# Patient Record
Sex: Female | Born: 1955 | Race: Black or African American | Hispanic: No | Marital: Married | Smoking: Never smoker
Health system: Southern US, Community
[De-identification: ages and names within clinical notes are randomized; demographics above are authoritative.]

## PROBLEM LIST (undated history)

## (undated) DIAGNOSIS — M8580 Other specified disorders of bone density and structure, unspecified site: Secondary | ICD-10-CM

## (undated) DIAGNOSIS — C50919 Malignant neoplasm of unspecified site of unspecified female breast: Secondary | ICD-10-CM

## (undated) DIAGNOSIS — C801 Malignant (primary) neoplasm, unspecified: Secondary | ICD-10-CM

## (undated) DIAGNOSIS — M7501 Adhesive capsulitis of right shoulder: Secondary | ICD-10-CM

## (undated) DIAGNOSIS — I10 Essential (primary) hypertension: Secondary | ICD-10-CM

## (undated) HISTORY — DX: Malignant (primary) neoplasm, unspecified: C80.1

## (undated) HISTORY — DX: Other specified disorders of bone density and structure, unspecified site: M85.80

## (undated) HISTORY — PX: MYOMECTOMY: SHX85

## (undated) HISTORY — PX: COLONOSCOPY: SHX174

## (undated) HISTORY — DX: Malignant neoplasm of unspecified site of unspecified female breast: C50.919

---
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
## 2002-03-25 ENCOUNTER — Encounter: Payer: Self-pay | Admitting: Internal Medicine

## 2002-03-25 ENCOUNTER — Encounter: Admission: RE | Admit: 2002-03-25 | Discharge: 2002-03-25 | Payer: Self-pay | Admitting: Internal Medicine

## 2002-03-31 ENCOUNTER — Encounter: Admission: RE | Admit: 2002-03-31 | Discharge: 2002-03-31 | Payer: Self-pay | Admitting: Internal Medicine

## 2002-03-31 ENCOUNTER — Encounter: Payer: Self-pay | Admitting: Internal Medicine

## 2002-07-24 ENCOUNTER — Encounter: Admission: RE | Admit: 2002-07-24 | Discharge: 2002-07-24 | Payer: Self-pay | Admitting: Internal Medicine

## 2002-07-24 ENCOUNTER — Encounter: Payer: Self-pay | Admitting: Internal Medicine

## 2003-06-01 ENCOUNTER — Other Ambulatory Visit: Admission: RE | Admit: 2003-06-01 | Discharge: 2003-06-01 | Payer: Self-pay | Admitting: Obstetrics and Gynecology

## 2003-06-08 ENCOUNTER — Encounter: Admission: RE | Admit: 2003-06-08 | Discharge: 2003-06-08 | Payer: Self-pay | Admitting: Internal Medicine

## 2003-06-08 ENCOUNTER — Encounter: Payer: Self-pay | Admitting: Internal Medicine

## 2003-06-14 ENCOUNTER — Inpatient Hospital Stay (HOSPITAL_COMMUNITY): Admission: RE | Admit: 2003-06-14 | Discharge: 2003-06-19 | Payer: Self-pay | Admitting: Obstetrics and Gynecology

## 2003-06-14 ENCOUNTER — Encounter (INDEPENDENT_AMBULATORY_CARE_PROVIDER_SITE_OTHER): Payer: Self-pay | Admitting: *Deleted

## 2003-06-14 ENCOUNTER — Encounter: Payer: Self-pay | Admitting: Obstetrics and Gynecology

## 2003-06-15 ENCOUNTER — Encounter: Payer: Self-pay | Admitting: Obstetrics and Gynecology

## 2003-11-11 ENCOUNTER — Encounter: Admission: RE | Admit: 2003-11-11 | Discharge: 2003-11-11 | Payer: Self-pay | Admitting: Obstetrics and Gynecology

## 2003-11-11 ENCOUNTER — Encounter (INDEPENDENT_AMBULATORY_CARE_PROVIDER_SITE_OTHER): Payer: Self-pay | Admitting: *Deleted

## 2003-11-11 ENCOUNTER — Encounter (INDEPENDENT_AMBULATORY_CARE_PROVIDER_SITE_OTHER): Payer: Self-pay | Admitting: Radiology

## 2003-11-24 ENCOUNTER — Encounter (HOSPITAL_COMMUNITY): Admission: RE | Admit: 2003-11-24 | Discharge: 2003-11-26 | Payer: Self-pay | Admitting: General Surgery

## 2003-11-29 ENCOUNTER — Ambulatory Visit (HOSPITAL_COMMUNITY): Admission: RE | Admit: 2003-11-29 | Discharge: 2003-11-29 | Payer: Self-pay | Admitting: General Surgery

## 2003-11-29 ENCOUNTER — Encounter (INDEPENDENT_AMBULATORY_CARE_PROVIDER_SITE_OTHER): Payer: Self-pay | Admitting: *Deleted

## 2003-11-29 ENCOUNTER — Encounter: Admission: RE | Admit: 2003-11-29 | Discharge: 2003-11-29 | Payer: Self-pay | Admitting: General Surgery

## 2003-11-29 ENCOUNTER — Ambulatory Visit (HOSPITAL_BASED_OUTPATIENT_CLINIC_OR_DEPARTMENT_OTHER): Admission: RE | Admit: 2003-11-29 | Discharge: 2003-11-29 | Payer: Self-pay | Admitting: General Surgery

## 2003-12-27 ENCOUNTER — Encounter: Admission: RE | Admit: 2003-12-27 | Discharge: 2003-12-27 | Payer: Self-pay | Admitting: Oncology

## 2003-12-28 ENCOUNTER — Ambulatory Visit: Admission: RE | Admit: 2003-12-28 | Discharge: 2004-01-25 | Payer: Self-pay | Admitting: Radiation Oncology

## 2003-12-31 ENCOUNTER — Ambulatory Visit (HOSPITAL_COMMUNITY): Admission: RE | Admit: 2003-12-31 | Discharge: 2003-12-31 | Payer: Self-pay | Admitting: General Surgery

## 2004-01-07 ENCOUNTER — Ambulatory Visit (HOSPITAL_COMMUNITY): Admission: RE | Admit: 2004-01-07 | Discharge: 2004-01-07 | Payer: Self-pay | Admitting: Oncology

## 2004-01-10 ENCOUNTER — Ambulatory Visit (HOSPITAL_COMMUNITY): Admission: RE | Admit: 2004-01-10 | Discharge: 2004-01-10 | Payer: Self-pay | Admitting: Oncology

## 2004-04-25 ENCOUNTER — Ambulatory Visit: Admission: RE | Admit: 2004-04-25 | Discharge: 2004-07-24 | Payer: Self-pay | Admitting: Radiation Oncology

## 2004-04-27 ENCOUNTER — Encounter: Admission: RE | Admit: 2004-04-27 | Discharge: 2004-04-27 | Payer: Self-pay | Admitting: Radiation Oncology

## 2004-05-11 ENCOUNTER — Ambulatory Visit (HOSPITAL_BASED_OUTPATIENT_CLINIC_OR_DEPARTMENT_OTHER): Admission: RE | Admit: 2004-05-11 | Discharge: 2004-05-11 | Payer: Self-pay | Admitting: General Surgery

## 2004-08-08 ENCOUNTER — Ambulatory Visit: Admission: RE | Admit: 2004-08-08 | Discharge: 2004-08-08 | Payer: Self-pay | Admitting: Radiation Oncology

## 2004-08-29 ENCOUNTER — Encounter: Admission: RE | Admit: 2004-08-29 | Discharge: 2004-08-29 | Payer: Self-pay | Admitting: Oncology

## 2004-10-17 ENCOUNTER — Ambulatory Visit: Admission: RE | Admit: 2004-10-17 | Discharge: 2004-10-17 | Payer: Self-pay | Admitting: Radiation Oncology

## 2004-11-19 HISTORY — PX: BREAST LUMPECTOMY: SHX2

## 2004-11-19 HISTORY — PX: ABDOMINAL HYSTERECTOMY: SHX81

## 2004-11-22 ENCOUNTER — Ambulatory Visit: Payer: Self-pay | Admitting: Oncology

## 2004-12-06 ENCOUNTER — Ambulatory Visit (HOSPITAL_COMMUNITY): Admission: RE | Admit: 2004-12-06 | Discharge: 2004-12-06 | Payer: Self-pay | Admitting: Oncology

## 2004-12-27 ENCOUNTER — Encounter: Admission: RE | Admit: 2004-12-27 | Discharge: 2004-12-27 | Payer: Self-pay | Admitting: Oncology

## 2005-02-06 ENCOUNTER — Ambulatory Visit (HOSPITAL_COMMUNITY): Admission: RE | Admit: 2005-02-06 | Discharge: 2005-02-06 | Payer: Self-pay | Admitting: Oncology

## 2005-02-12 ENCOUNTER — Ambulatory Visit: Payer: Self-pay | Admitting: Oncology

## 2005-05-28 ENCOUNTER — Ambulatory Visit: Payer: Self-pay | Admitting: Oncology

## 2005-11-16 ENCOUNTER — Ambulatory Visit: Payer: Self-pay | Admitting: Oncology

## 2005-11-22 ENCOUNTER — Ambulatory Visit (HOSPITAL_COMMUNITY): Admission: RE | Admit: 2005-11-22 | Discharge: 2005-11-22 | Payer: Self-pay | Admitting: Oncology

## 2006-05-20 ENCOUNTER — Ambulatory Visit: Payer: Self-pay | Admitting: Oncology

## 2006-05-28 ENCOUNTER — Encounter: Admission: RE | Admit: 2006-05-28 | Discharge: 2006-05-28 | Payer: Self-pay | Admitting: Oncology

## 2006-05-28 LAB — COMPREHENSIVE METABOLIC PANEL
ALT: 16 U/L (ref 0–40)
BUN: 16 mg/dL (ref 6–23)
CO2: 28 mEq/L (ref 19–32)
Calcium: 9.4 mg/dL (ref 8.4–10.5)
Chloride: 101 mEq/L (ref 96–112)
Creatinine, Ser: 0.77 mg/dL (ref 0.40–1.20)

## 2006-05-28 LAB — CBC WITH DIFFERENTIAL/PLATELET
Eosinophils Absolute: 0.4 10*3/uL (ref 0.0–0.5)
HCT: 39.3 % (ref 34.8–46.6)
HGB: 13.3 g/dL (ref 11.6–15.9)
LYMPH%: 39.1 % (ref 14.0–48.0)
MONO#: 0.5 10*3/uL (ref 0.1–0.9)
NEUT#: 2.3 10*3/uL (ref 1.5–6.5)
NEUT%: 43.2 % (ref 39.6–76.8)
Platelets: 272 10*3/uL (ref 145–400)
WBC: 5.4 10*3/uL (ref 3.9–10.0)

## 2006-05-28 LAB — CANCER ANTIGEN 27.29: CA 27.29: 30 U/mL (ref 0–39)

## 2006-06-10 ENCOUNTER — Encounter: Admission: RE | Admit: 2006-06-10 | Discharge: 2006-06-10 | Payer: Self-pay | Admitting: Oncology

## 2006-07-17 ENCOUNTER — Encounter: Admission: RE | Admit: 2006-07-17 | Discharge: 2006-07-17 | Payer: Self-pay | Admitting: Oncology

## 2006-11-16 ENCOUNTER — Ambulatory Visit: Payer: Self-pay | Admitting: Oncology

## 2006-11-21 LAB — COMPREHENSIVE METABOLIC PANEL
ALT: 14 U/L (ref 0–35)
Albumin: 4.8 g/dL (ref 3.5–5.2)
CO2: 29 mEq/L (ref 19–32)
Calcium: 9.9 mg/dL (ref 8.4–10.5)
Chloride: 100 mEq/L (ref 96–112)
Creatinine, Ser: 0.76 mg/dL (ref 0.40–1.20)
Potassium: 3.5 mEq/L (ref 3.5–5.3)
Sodium: 139 mEq/L (ref 135–145)
Total Protein: 8.2 g/dL (ref 6.0–8.3)

## 2006-11-21 LAB — CBC WITH DIFFERENTIAL/PLATELET
BASO%: 0.8 % (ref 0.0–2.0)
HCT: 39 % (ref 34.8–46.6)
MCHC: 33.3 g/dL (ref 32.0–36.0)
MONO#: 0.6 10*3/uL (ref 0.1–0.9)
NEUT%: 48 % (ref 39.6–76.8)
RDW: 13.4 % (ref 11.3–14.5)
WBC: 5.8 10*3/uL (ref 3.9–10.0)
lymph#: 1.9 10*3/uL (ref 0.9–3.3)

## 2007-05-30 ENCOUNTER — Ambulatory Visit: Payer: Self-pay | Admitting: Oncology

## 2007-06-03 LAB — CBC WITH DIFFERENTIAL/PLATELET
Basophils Absolute: 0 10*3/uL (ref 0.0–0.1)
Eosinophils Absolute: 0.2 10*3/uL (ref 0.0–0.5)
LYMPH%: 38.2 % (ref 14.0–48.0)
MCV: 88.7 fL (ref 81.0–101.0)
MONO%: 9.2 % (ref 0.0–13.0)
NEUT#: 2.1 10*3/uL (ref 1.5–6.5)
Platelets: 242 10*3/uL (ref 145–400)
RBC: 4.24 10*6/uL (ref 3.70–5.32)

## 2007-06-03 LAB — COMPREHENSIVE METABOLIC PANEL
Alkaline Phosphatase: 77 U/L (ref 39–117)
BUN: 13 mg/dL (ref 6–23)
Creatinine, Ser: 0.8 mg/dL (ref 0.40–1.20)
Glucose, Bld: 83 mg/dL (ref 70–99)
Total Bilirubin: 0.6 mg/dL (ref 0.3–1.2)

## 2007-07-08 ENCOUNTER — Encounter: Admission: RE | Admit: 2007-07-08 | Discharge: 2007-07-08 | Payer: Self-pay | Admitting: Oncology

## 2007-12-01 ENCOUNTER — Ambulatory Visit: Payer: Self-pay | Admitting: Oncology

## 2007-12-03 LAB — CBC WITH DIFFERENTIAL/PLATELET
BASO%: 0.5 % (ref 0.0–2.0)
EOS%: 7.8 % — ABNORMAL HIGH (ref 0.0–7.0)
Eosinophils Absolute: 0.4 10*3/uL (ref 0.0–0.5)
HCT: 35.7 % (ref 34.8–46.6)
LYMPH%: 36.9 % (ref 14.0–48.0)
MONO#: 0.5 10*3/uL (ref 0.1–0.9)
NEUT#: 2.3 10*3/uL (ref 1.5–6.5)
NEUT%: 45.2 % (ref 39.6–76.8)
Platelets: 258 10*3/uL (ref 145–400)
WBC: 5 10*3/uL (ref 3.9–10.0)

## 2007-12-03 LAB — COMPREHENSIVE METABOLIC PANEL
ALT: 13 U/L (ref 0–35)
AST: 18 U/L (ref 0–37)
Albumin: 4.4 g/dL (ref 3.5–5.2)
Alkaline Phosphatase: 72 U/L (ref 39–117)
BUN: 15 mg/dL (ref 6–23)
Potassium: 4.2 mEq/L (ref 3.5–5.3)

## 2008-05-31 ENCOUNTER — Encounter: Admission: RE | Admit: 2008-05-31 | Discharge: 2008-05-31 | Payer: Self-pay | Admitting: Oncology

## 2008-07-22 ENCOUNTER — Encounter: Admission: RE | Admit: 2008-07-22 | Discharge: 2008-07-22 | Payer: Self-pay | Admitting: Oncology

## 2008-07-28 ENCOUNTER — Encounter: Admission: RE | Admit: 2008-07-28 | Discharge: 2008-07-28 | Payer: Self-pay | Admitting: Oncology

## 2008-08-20 ENCOUNTER — Ambulatory Visit: Payer: Self-pay | Admitting: Oncology

## 2008-08-24 LAB — CBC WITH DIFFERENTIAL/PLATELET
Eosinophils Absolute: 0.3 10*3/uL (ref 0.0–0.5)
HGB: 12.1 g/dL (ref 11.6–15.9)
MCH: 30 pg (ref 26.0–34.0)
MONO%: 9 % (ref 0.0–13.0)
NEUT#: 2.5 10*3/uL (ref 1.5–6.5)
NEUT%: 45.4 % (ref 39.6–76.8)
lymph#: 2.2 10*3/uL (ref 0.9–3.3)

## 2008-08-25 LAB — COMPREHENSIVE METABOLIC PANEL
Albumin: 4.5 g/dL (ref 3.5–5.2)
Alkaline Phosphatase: 74 U/L (ref 39–117)
BUN: 11 mg/dL (ref 6–23)
Calcium: 9.9 mg/dL (ref 8.4–10.5)
Chloride: 103 mEq/L (ref 96–112)
Creatinine, Ser: 0.71 mg/dL (ref 0.40–1.20)
Potassium: 4.1 mEq/L (ref 3.5–5.3)
Sodium: 140 mEq/L (ref 135–145)

## 2008-08-25 LAB — VITAMIN D 25 HYDROXY (VIT D DEFICIENCY, FRACTURES): Vit D, 25-Hydroxy: 44 ng/mL (ref 30–89)

## 2009-08-22 ENCOUNTER — Ambulatory Visit: Payer: Self-pay | Admitting: Oncology

## 2009-08-25 LAB — COMPREHENSIVE METABOLIC PANEL
ALT: 16 U/L (ref 0–35)
AST: 25 U/L (ref 0–37)
Albumin: 4 g/dL (ref 3.5–5.2)
BUN: 16 mg/dL (ref 6–23)
Calcium: 9.5 mg/dL (ref 8.4–10.5)
Potassium: 3.4 mEq/L — ABNORMAL LOW (ref 3.5–5.3)
Total Bilirubin: 0.6 mg/dL (ref 0.3–1.2)

## 2009-08-25 LAB — CBC WITH DIFFERENTIAL/PLATELET
Eosinophils Absolute: 0.2 10*3/uL (ref 0.0–0.5)
HCT: 37 % (ref 34.8–46.6)
HGB: 12.5 g/dL (ref 11.6–15.9)
LYMPH%: 47.9 % (ref 14.0–49.7)
MCH: 30.5 pg (ref 25.1–34.0)
MONO#: 0.4 10*3/uL (ref 0.1–0.9)
MONO%: 7.2 % (ref 0.0–14.0)
Platelets: 226 10*3/uL (ref 145–400)
RBC: 4.1 10*6/uL (ref 3.70–5.45)

## 2009-08-26 LAB — VITAMIN D 25 HYDROXY (VIT D DEFICIENCY, FRACTURES): Vit D, 25-Hydroxy: 30 ng/mL (ref 30–89)

## 2009-08-26 LAB — CANCER ANTIGEN 27.29: CA 27.29: 27 U/mL (ref 0–39)

## 2009-08-30 ENCOUNTER — Encounter: Admission: RE | Admit: 2009-08-30 | Discharge: 2009-08-30 | Payer: Self-pay | Admitting: Oncology

## 2010-09-05 ENCOUNTER — Ambulatory Visit: Payer: Self-pay | Admitting: Oncology

## 2010-09-08 LAB — CBC WITH DIFFERENTIAL/PLATELET
BASO%: 0.5 % (ref 0.0–2.0)
Basophils Absolute: 0 10*3/uL (ref 0.0–0.1)
EOS%: 5.6 % (ref 0.0–7.0)
Eosinophils Absolute: 0.3 10*3/uL (ref 0.0–0.5)
HCT: 36.5 % (ref 34.8–46.6)
HGB: 12.4 g/dL (ref 11.6–15.9)
LYMPH%: 39.9 % (ref 14.0–49.7)
MCH: 31.1 pg (ref 25.1–34.0)
MCHC: 34.1 g/dL (ref 31.5–36.0)
MCV: 91.3 fL (ref 79.5–101.0)
MONO#: 0.4 10*3/uL (ref 0.1–0.9)
MONO%: 7.6 % (ref 0.0–14.0)
NEUT#: 2.1 10*3/uL (ref 1.5–6.5)
NEUT%: 46.4 % (ref 38.4–76.8)
Platelets: 246 10*3/uL (ref 145–400)
RBC: 3.99 10*6/uL (ref 3.70–5.45)
RDW: 13.1 % (ref 11.2–14.5)
WBC: 4.6 10*3/uL (ref 3.9–10.3)
lymph#: 1.8 10*3/uL (ref 0.9–3.3)

## 2010-09-08 LAB — COMPREHENSIVE METABOLIC PANEL
ALT: 8 U/L (ref 0–35)
AST: 19 U/L (ref 0–37)
Albumin: 4 g/dL (ref 3.5–5.2)
Alkaline Phosphatase: 45 U/L (ref 39–117)
BUN: 14 mg/dL (ref 6–23)
CO2: 22 mEq/L (ref 19–32)
Calcium: 9.1 mg/dL (ref 8.4–10.5)
Chloride: 103 mEq/L (ref 96–112)
Creatinine, Ser: 0.72 mg/dL (ref 0.40–1.20)
Glucose, Bld: 89 mg/dL (ref 70–99)
Potassium: 3.7 mEq/L (ref 3.5–5.3)
Sodium: 139 mEq/L (ref 135–145)
Total Bilirubin: 0.4 mg/dL (ref 0.3–1.2)
Total Protein: 7 g/dL (ref 6.0–8.3)

## 2010-09-08 LAB — CANCER ANTIGEN 27.29: CA 27.29: 27 U/mL (ref 0–39)

## 2010-09-22 ENCOUNTER — Encounter: Admission: RE | Admit: 2010-09-22 | Discharge: 2010-09-22 | Payer: Self-pay | Admitting: Oncology

## 2010-12-09 ENCOUNTER — Encounter: Payer: Self-pay | Admitting: Oncology

## 2010-12-09 ENCOUNTER — Encounter (HOSPITAL_BASED_OUTPATIENT_CLINIC_OR_DEPARTMENT_OTHER): Payer: Self-pay | Admitting: General Surgery

## 2010-12-11 ENCOUNTER — Encounter: Payer: Self-pay | Admitting: Oncology

## 2011-04-06 NOTE — Op Note (Signed)
NAME:  Courtney, Keller                         ACCOUNT NO.:  [id number]   MEDICAL RECORD NO.:  [id number]                   PATIENT TYPE:  INP   LOCATION:  9324                                 [facility]ILITY:  [facility]   PHYSICIAN:  Janine Limbo, M.D.            DATE OF BIRTH:  [birth year]   DATE OF PROCEDURE:  06/14/2003  DATE OF DISCHARGE:                                 OPERATIVE REPORT   PREOPERATIVE DIAGNOSES:  1. Eighteen-week size fibroid uterus.  2. Menorrhagia.  3. Dysmenorrhea.  4. Three prior myomectomies.  5. Prior left salpingo-oophorectomy.  6. History of endometriosis.  7. History of severe pelvic adhesions.   POSTOPERATIVE DIAGNOSES:  1. Eighteen-week size fibroid uterus.  2. Menorrhagia.  3. Dysmenorrhea.  4. Three prior myomectomies.  5. Prior left salpingo-oophorectomy.  6. History of endometriosis.  7. History of severe pelvic adhesions.  8. Anemia (hemoglobin 10.5).   PROCEDURES:  1. Total abdominal hysterectomy.  2. Right salpingo-oophorectomy.  3. Extensive lysis of adhesions.  4. Placement of ureteral catheters.  5. Ureterolysis.  6. Partial colectomy with reanastomosis.  7. Partial small bowel resection with reanastomosis.   SURGEONS:  Janine Limbo, M.D., Lindaann Slough, M.D., and Leonie Man, M.D.   ANESTHESIA:  General.   DISPOSITION:  Courtney Keller is a 55 year old female, gravida 0, who presents  for abdominal hysterectomy and right salpingo-oophorectomy.  The patient has  a history of enlarging fibroids.  She is experiencing increasing  dysmenorrhea and menorrhagia that now limits her ability to work and perform  her normal daily functions.  The patient has had three myomectomies in the  past.  In addition, she has also had a uterine artery embolization.  She has  also had a left salpingo-oophorectomy.  She has a known history of  endometriosis and a known history of severe pelvic adhesive disease.  Pain  medication is no longer  allowing her to function.  She is ready at this  point to proceed with definitive therapy.  She understands the indications  for her surgical procedure and she accepts the risks of, but not limited to,  anesthetic complications, bleeding, infection, and possible damage to the  surrounding organs.  She understands that estrogen replacement therapy will  be recommended.  We have discussed the [facility] findings,  and the patient accepts the risks associated with hormone replacement.   FINDINGS:  A 14-16 week size fibroid uterus was removed.  There were severe  and dense adhesions between the anterior abdominal wall, the small bowel,  the large bowel, the anterior uterus, the posterior uterus, the lateral  pelvic sidewalls, and the posterior cul-de-sac.  The right fallopian tube  and ovary were severely adhered to the bowel.  There was a 2-3 cm firm  nodular mass in the colon.  A portion of the colon was removed and this  sample was sent to pathology for frozen section.  Frozen section analysis  showed that this was endometriosis that had grown through the wall of the  bowel.  The estimated weight of the uterus was 783 g.  Operative time was  six hours.  The bladder was filled with sterile milk and there was no  evidence of damage to the bladder.  At the end of our procedure, urine was  noted to easily pass through both ureteral catheters.  An enterotomy was  noted, and a portion of the small bowel was resected.  Following  reanastomosis, the bowel appeared normal.   PROCEDURE:  The patient was taken to the operating room, where a general  anesthetic was given.  Examination under anesthesia was performed.  Dr. Brunilda Payor  then prepped and draped the patient for cystoscopy.  Cystoscopy was  performed and ureteral catheters were placed without difficulty.  This  portion of the operative note will be dictated in detail by Dr. Brunilda Payor.  The  patient's abdomen, perineum, and vagina were  then re-prepped with multiple  layers of Betadine.  The Foley catheter had previously been placed.  The  patient was sterilely draped.  The patient had an extensive midline scar  from her prior scar.  The midline area was injected with 20 mL of 0.5%  Marcaine without epinephrine.  An incision was made in the midline of the  lower abdomen and extended above the umbilicus.  The old scar was surgically  removed.  There was dense scarring present.  We were able to extend the  incision through the subcutaneous tissue and through portions of the fascia.  We were unable to successfully enter the lower abdomen because of adhesions.  The upper incision was extended slightly so that we could enter the  peritoneal cavity at a place where no previous surgery had been done.  We  were able to successfully enter the peritoneal cavity.  We then confirmed  that there were dense adhesions between the small bowel, large bowel, and  the anterior abdominal wall.  Dr. Leonie Man then attended the surgery  and Dr. Lurene Shadow performed an extensive lysis of adhesions so that we could  enter the abdominal cavity.  The lysis of adhesions was continued such that  we were able to remove the bowel from the anterior uterus and the posterior  uterus.  Dr. Lurene Shadow will dictate this portion of the operative note in  detail.  Dr. Brunilda Payor then proceeded to lyse the adhesions between the anterior  uterus and the bladder.  Dr. Brunilda Payor was then able to lyse the adhesions  between the lateral uterus and the pelvic sidewall.  Care was taken not to  damage the ureters.  Dr. Brunilda Payor carefully followed the ureters through their  course in the pelvis.  Dr. Brunilda Payor will dictate this portion of the operative  note in detail.  Once we were able to elevate the fibroid uterus into the  operative field, the right infundibulopelvic ligament was then isolated. The ligament was then doubly clamped, cut, free-tied, and then suture  ligated.  The uterine  arteries were skeletonized bilaterally.  The uterine  arteries were then doubly clamped, cut, sutured, and tied securely.  The  fundus of the uterus was transected to aid in visualization.  The bladder  was then further advanced from the anterior uterus.  The paracervical  tissues, uterosacral ligaments, and then vaginal angles were clamped, cut,  sutured, and tied securely.  The cervix was then transected from the apex of  the vagina.  The apex of the vagina was closed using figure-of-eight  sutures.  Again care was taken not to damage the ureters, the bladder, and  the bowel.  The bladder was then filled with sterile milk.  There was no  evidence of damage to the bladder, and there was no evidence of milk  leaking.  The bladder was allowed to drain.  An incidental enterotomy was  noted, and Dr. Lurene Shadow then did a resection of this portion with  reanastomosis.  The lumen was noted to be open.  There was a 2-3 cm firm  mass in the colon that penetrated the entire wall.  This segment of the  colon was then resected and sent for frozen section analysis.  A  reanastomosis was then performed by Dr. Lurene Shadow.  Dr. Lurene Shadow will dictate  this portion of the operative note in detail.  The frozen section returned  showing endometriosis.  Dr. Lurene Shadow then completely ran the bowel to be sure  there was no other evidence of damage.  The pelvis was vigorously irrigated.  Hemostasis was achieved using figure-of-eight sutures.  At this point  hemostasis was noted to be adequate.  Seprafilm was placed on the bowel  between the bowel and the anterior abdominal wall.  The fascia, abdominal  musculature, and the anterior peritoneum were then closed using two running  sutures of 0 PDS from the corners to the midline.  The subcutaneous layer  was then irrigated.  Hemostasis was achieved using the Bovie cautery.  The  subcutaneous layer was irrigated.  The skin was then reapproximated using  skin staples.  Sponge,  needle, and instrument counts were correct on two  occasions.  The estimated blood loss for the procedure was 1500 mL.  The  estimated urine output was 600 mL, and clear urine was noted.  The patient  received 4500 mL of crystalloid fluid.  Vicryl 0 was the suture material  used for the hysterectomy portion.  Silk 3-0 and 2-0 were used for repair of  the bowel.  The patient was noted to have clear urine draining from both  ureteral catheters.  The ureteral catheters were then removed.  Clear urine  was noted in the Foley bag.  The patient was awakened from her anesthetic  and then taken to the recovery room in stable condition.  She tolerated her  procedure well.                                                 Janine Limbo, M.D.    AVS/MEDQ  D:  06/14/2003  T:  06/15/2003  Job:  [id number]   cc:   Lindaann Slough, M.D.  [address]  [place]  [place] [address] Fax: [phone]   Leonie Man, M.D.  [address]  [place]  [place] [address]  Fax: [phone]

## 2011-04-06 NOTE — Op Note (Signed)
NAME:  Courtney Keller, Courtney Keller                         ACCOUNT NO.:  [id number]   MEDICAL RECORD NO.:  [id number]                   PATIENT TYPE:  OIB   LOCATION:  2899                                 FACILITY:  [facility]   PHYSICIAN:  Leonie Man, M.D.                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   PROCEDURE:  Port-A-Cath implantation.   SURGEON:  Leonie Man, M.D.   ASSISTANT:  Nurse.   ANESTHESIA:  MAC.  I used Marcaine with epinephrine 1:200,000, and lidocaine  plain, 1.   NOTE:  The patient is a 55 year old female with recently diagnosed breast  cancer, status post lumpectomy and sentinel lymph node biopsy, currently  being prepared for chemotherapy.  She comes to the operating room now for  the insertion of a port-A-Cath to facilitate her chemotherapy.  The risks  and potential benefits of surgery have been fully discussed with her.  All  questions have been answered and consent obtained.   PROCEDURE:  Following the induction of satisfactory sedation, the patient is  positioned supinely, and the anterior chest and neck are prepped and draped  to be included in the sterile operative field.  I infiltrated the right  subclavian region with a subclavian stick into the right subclavian vein.  The guide wire was threaded into the superior vena cava under fluoroscopic  guidance and positioned at the atrial vena caval junction.  I then created a  pocket on the anterior chest wall by infiltrating the anterior chest wall  with 0.5% Marcaine with epinephrine.  A transverse incision was made in the  pocket created by excising a portion of the fat and the underlying breast  tissue. A tunnel was created from the pocket up to the shoulder  wound, and  the Silastic catheter was brought through.  The Silastic catheter was then  inserted into the central venous system after a dilator and introducer were  placed up with a guide wire.  The guide wire and the dilator were removed,  and the catheter inserted and positioned at the atrial vena caval junction.  Inflow of heparinized saline and blood return were noted to be excellent.  The end of the Silastic catheter that was in the pocket now was trimmed and  securely attached to the reservoir.  Inflow of heparinized saline and blood  return through the reservoir were noted to be excellent.  The reservoir was  then securely positioned in the pocket and sutured in place with 2-0 silk  sutures.  Sponge and instrument counts were verified.  The final  radiographic evaluation of the port-A-Cath showed the distal tip to be in  the vena cava  at the atrial vena caval junction.  There were no kinks or  unusual turns in the course of the catheter.  The wounds were then closed in  layers as follows.   The pocket and the shoulder wound closed in two layers with interrupted 3-0  Vicryl sutures, followed by a running 4-0 Monocryl suture.  The wound were  then reinforced with Steri-Strips.  Concentrated heparin flush was placed in  the reservoir.  Sterile dressings were then placed on the wound.  The  anesthetic reversed and the patient removed from the operating room to the  recovery room in stable condition.  She tolerated the procedure well.                                               Leonie Man, M.D.    PB/MEDQ  D:  12/31/2003  T:  01/01/2004  Job:  [id number]

## 2011-04-06 NOTE — Op Note (Signed)
NAME:  Courtney Keller, Courtney Keller                         ACCOUNT NO.:  [id number]   MEDICAL RECORD NO.:  [id number]                   PATIENT TYPE:  AMB   LOCATION:  DSC                                  FACILITY:  [facility]   PHYSICIAN:  Leonie Man, M.D.                DATE OF BIRTH:  [birth year]   DATE OF PROCEDURE:  11/29/2003  DATE OF DISCHARGE:                                 OPERATIVE REPORT   PREOPERATIVE DIAGNOSIS:  Carcinoma of the left breast.   POSTOPERATIVE DIAGNOSIS:  Carcinoma of the left breast.   PROCEDURES:  1. Left lumpectomy with sentinel lymph node biopsy.  2. Axillary lymph node dissection.  3. Subdermal Lymphazurin injection.   SURGEON:  Leonie Man, M.D.   ASSISTANT:  Nurse.   ANESTHESIA:  General.   INDICATIONS FOR PROCEDURE:  This patient is a 55 year old woman who noted a  left sided breast mass which on mammogram and ultrasound showed a solid mass  in the lower inner quadrant of the left breast at approximately the 7  o'clock axis.  She subsequently had a large core biopsy of this lesion and  it showed an invasive carcinoma.  This tumor is strongly estrogen and  progesterone positive.  Her-2-NU is negative.  The patient comes to the  operating room now for a lumpectomy, sentinel node biopsy and possible left  axillary dissection after the risks and potential benefits have been fully  discussed.  All questions were answered and consent obtained.   DESCRIPTION OF PROCEDURE:  The patient comes to the operating room after the  filtered technetium sulfa colloid had been injected and following the  induction of satisfactory endotracheal anesthesia the left periareolar area  was infiltrated subdermally with 5 mL of Lymphazurin dye.  This was massaged  for approximately five minutes.  The left breast and axilla were then  prepped and draped to be included in the sterile operative field.  The mass  was palpated in the region of the lower inner quadrant and an  elliptical  incision was carried down over the mass deepening this through the skin and  subcutaneous tissues and raising flaps superiorly, inferiorly, medially and  laterally so as to get a wide margin of breast tissue.  The deep margin was  carried down to the pectoralis fascia.  The mass was removed in its entirety  and forwarded for pathologic evaluation.  Tucks preps showed that the  margins were free of tumor.  Attention was then turned to the left axilla  where a transverse axillary incision was made after using the Neoprobe to  locate a spot where the counts were highest in the axilla.  This was  deepened through the skin and subcutaneous tissues and carried down to the  region of the sentinel node using the Neoprobe as a guide. At this point, a  hot blue node was located in the mid axilla.  This was  dissected free on all  sides maintaining hemostasis with electrocautery and/or ties of 3-0 silk.  The hot blue node was then forwarded for pathologic evaluation along with  two other nodes.  The sentinel node was treated with Touch-Prep and showed  metastatic carcinoma.  We then decided to proceed with left axillary lymph  node dissection.   The axillary incision was extended anteriorly to the posterior edge of the  pectoralis major muscle and extended posteriorly down to the anterior border  of the latissimus dorsi muscle.  Dissection was then carried onto the  pectoralis major muscle and onto the pectoralis minor muscle, up into the  axilla.  The left axillary vein was identified and the lymphatic contents  from over and below the left axillary vein were dissected down from [facility]  ligament carrying the dissection laterally and maintaining hemostasis along  the course of the dissection with hemoclips.  The dissection came laterally.  The intercostal humeral nerve was encountered and this was transected.  The  long thoracic and thoracodorsal nerves more laterally were also found  and  spared.  The remainder of the axillary contents attached to the latissimus  dorsi were then divided between clamps, removed and forwarded for pathologic  evaluation.  The axillary wound was thoroughly irrigated with normal saline.  Additional bleeding points were treated with electrocautery.  A 19 [place]  Blake drain was then placed in the axilla for drainage.  The axillary  subcutaneous tissues were then closed with a running 2-0 Vicryl suture and  the drain was secured to the skin with a 3-0 nylon suture.  The drain was  attached to suction.  The breast wound was similarly closed with interrupted  0 Vicryl sutures in the subcutaneous tissue and a running 5-0 Monocryl  suture in the skin.  Both wounds were then reinforced with Steri-Strips.  Additional analgesia was injected with 0.5% Marcaine with epinephrine  1:200,000.  Sponge and instrument counts were verified.  Sterile dressings  were applied to the wounds.  The anesthetic was then reversed and the  patient was removed from the operating room to the recovery room in stable  condition.  She tolerated the procedure well.                                               Leonie Man, M.D.    PB/MEDQ  D:  11/29/2003  T:  11/29/2003  Job:  [id number]   cc:   Janine Limbo, M.D.  [address]  [place]  [place] [address]  Fax: [phone]

## 2011-04-06 NOTE — H&P (Signed)
NAME:  Courtney Keller, Courtney Keller                 ACCOUNT NO.:  [id number]   MEDICAL RECORD NO.:  [id number]                   PATIENT TYPE:  INP   LOCATION:  NA                                   [facility]   PHYSICIAN:  Janine Limbo, M.D.            DATE OF BIRTH:  [birth year]   DATE OF ADMISSION:  06/14/2003  DATE OF DISCHARGE:                                HISTORY & PHYSICAL   HISTORY OF PRESENT ILLNESS:  Mrs. Ladona Ridgel is a 55 year old married female,  gravida 0, who presents for a total abdominal hysterectomy because of  fibroids.  The patient is experiencing increasing dysmenorrhea and  menorrhagia.  The patient has had three myomectomies in the past.  She also  has had uterine artery embolization.  Her discomfort is not being controlled  by medication.  At this point she wishes to proceed with definitive therapy.  The patient has also had a left salpingo-oophorectomy.  The patient has a  known history of endometriosis.  Her most recent Pap smear was within normal  limits.   PAST MEDICAL HISTORY:  The patient has a history of hypertension.  She is  currently taking hydrochlorothiazide 25 mg each day.   DRUG ALLERGIES:  No known drug allergies.   SOCIAL HISTORY:  The patient drinks alcohol socially.  She denies cigarette  use and recreational drug use.   REVIEW OF SYSTEMS:  The patient experiences dysmenorrhea and dyspareunia.  She wears glasses for reading.   FAMILY HISTORY:  The patient's mother and maternal grandmother have  hypertension.  She denies other medical family illnesses.   PHYSICAL EXAMINATION:  VITAL SIGNS:  Weight is 171 pounds.  HEENT:  Within normal limits.  CHEST:  Clear.  HEART:  Regular rate and rhythm.  BREASTS:  Without masses.  ABDOMEN:  Nontender.  There is a large pelvic mass extending to the level of  the umbilicus.  There is a large midline scar.  EXTREMITIES:  Within normal limits.  NEUROLOGIC:  Grossly normal.  PELVIC:  External  genitalia is normal.  The vagina is normal.  The cervix is  nontender.  The uterus is 18 to 20 weeks size, mobile, and nontender.  Adnexa:  No masses are appreciated.  Rectovaginal exam confirms.   ASSESSMENT:  1. Large recurrent fibroid uterus.  2. Three prior myomectomies.  3. Prior left salpingo-oophorectomy.  4. Hypertension.  5. History of severe pelvic adhesions.  6. History of endometriosis.   PLAN:  The patient will undergo a total abdominal hysterectomy.  Because of  her history of extensive pelvic adhesions and complications from her prior  surgery including injury to the other internal organs we will place  catheters in her ureters.  Dr. Su Grand will also perform a uterolysis in  hopes of minimizing our chances of damaging the ureters.  The patient will  decide between now and the date of her surgery if she wants to have her  right tube  and ovary removed.  She understands the indications for her  procedure as well as the alternative treatments.  She accepts the risks of,  but not limited to, anesthetic complications, bleeding, infections, and  possible damage to the surrounding organs.                                               Janine Limbo, M.D.    AVS/MEDQ  D:  06/01/2003  T:  06/01/2003  Job:  [id number]   cc:   Lindaann Slough, M.D.  [address]  [place]  [place] [address]  Fax: [phone]

## 2011-04-06 NOTE — Discharge Summary (Signed)
NAME:  Courtney, Keller                         ACCOUNT NO.:  [id number]   MEDICAL RECORD NO.:  [id number]                   PATIENT TYPE:  INP   LOCATION:  9324                                 FACILITY:  [facility]   PHYSICIAN:  Janine Limbo, M.D.            DATE OF BIRTH:  Sep 09, 1956   DATE OF ADMISSION:  06/14/2003  DATE OF DISCHARGE:  06/19/2003                                 DISCHARGE SUMMARY   DISCHARGE DIAGNOSES:  1. Fibroid uterus.  2. Menorrhagia.  3. Dysmenorrhea.  4. Anemia.  5. Endometriosis.  6. Extensive pelvic adhesions.  7. Adenomyosis.   OPERATION:  On the date of admission, the patient underwent a total  abdominal hysterectomy with a right salpingo-oophorectomy, extensive lysis  of adhesions, placement of a urethral catheter, ureterolysis and bowel  resection tolerating all procedures well.   During the course of the patient's procedure, she was found to have a 14- to  16-week size uterus with multiple fibroids weighing approximately 783 grams.  There were severe and dense adhesions between the anterior abdominal wall,  both small and large bowels, anterior posterior uterus and lateral pelvic  side wall including her posterior cul-de-sac.  Of note, her right fallopian  tube and ovary were also densely adhered to the bowel area.  A 2- to 3-cm  firm nodule or mass was observed in the sigmoid colon, resected then sent  for frozen section.  The results indicated endometriosis that had grown  through the wall of the bowel.  Additionally an enterotomy was noted and a  small portion of the small-bowel was resected and re-anastomosed.   HISTORY OF PRESENT ILLNESS:  [name] is a 55 year old married female with  a history of endometriosis who is status post myomectomy x 3 and left  salpingo-oophorectomy, gravida 0 who presents for a total abdominal  hysterectomy because of symptomatic uterine fibroids.  Please see the  patient's dictated history and physical  examination for details.   PRE-OP PHYSICAL EXAM:  VITAL SIGNS:  Weight is 171 pounds.  GENERAL:  Within normal limits.  ABDOMEN:  Nontender.  There is a large pelvic mass extending to the level of  the umbilicus.  There is also a large midline scar.  PELVIC:  External genitalia is normal.  Vagina is normal.  Cervix is  nontender.  Uterus is 18-20 weeks size, mobile and nontender.  Adnexa  without any appreciable masses.  Rectovaginal exam confirms.   HOSPITAL COURSE:  On the date of admission, the patient underwent  aforementioned procedures, tolerating them all well.  Though she was  asymptomatic, her post-op hemoglobin was 7.1 (pre-op hemoglobin was 10.5).  However, due to dilution her hemoglobin dropped as low as 6.8.  On post-op  day two the patient developed a temperature of 102.5, though her white blood  count remained within normal limits.  She was transfused two units of packed  red blood cells on  post-op day number two ran her hemoglobin to 9.0.  By  post-op day three the patient was afebrile but still was slow to resume  bowel function, however, by post-op day number five she had begun to  tolerate a regular diet, void and ambulate without difficulty and therefore,  deemed ready for discharge home.   DISCHARGE MEDICATIONS:  1. Vicodin 1-2 tablets every four hours as needed for pain.  2. Phenergan 25 mg one tablet every six hours as needed for nausea.  3. Estradial patch 0.05 mg one patch weekly as directed.  4. Iron 325 mg one tablet twice a day for eight weeks.   FOLLOW UP:  The patient has an appointment on June 21, 2003 at 10 a.m. for  staple removal at [facility], at that time she is to schedule a  six-week postoperative visit with Dr. Stefano Gaul.  She is to call Dr. Danella Penton  office for a two-weeks followup visit.   DISCHARGE INSTRUCTIONS:  1. The patient is to call for temperature greater than 100.4, severe pain or     problem.  2. She was given a copy  of [facility]  postoperative     instruction sheet.  3. Further advised to avoid driving for two weeks, heavy lifting for four     weeks, any intercourse for six weeks.   DIET:  Without restriction, however, she was encouraged to increase her  fiber and her fluid intake.   FINAL PATHOLOGY:  1. Segmental resection: colon endometriosis.  2. Hysterectomy, uterus, cervix, right fallopian tube, right ovary:  serosal     surfaces endometriosis; cervix:  squamous metaplasia and chronic     inflammation; endometrium:  weakly secretory endometrium, no evidence of     hyperplasia or malignancy; myometrium:  adenomyosis, leiomyomata     measuring up to 2.5-cm, some with microcalcification; right ovary:     cystic follicle and corpus luteum; right fallopian tube: endometriosis.     Resection of small intestine, small intestine with serosal adhesions.  No     evidence of malignancy.       [place]mira J. Adline Peals.                    Janine Limbo, M.D.    EJP/MEDQ  D:  07/06/2003  T:  07/06/2003  Job:  [id number]   cc:   Lindaann Slough, M.D.  [address]  [place]  [place] [address]  Fax: [phone]   Leonie Man, M.D.  [address]  [place]  [place] [address]  Fax: [phone]

## 2011-04-06 NOTE — Op Note (Signed)
NAME:  Courtney Keller, Courtney Keller                         ACCOUNT NO.:  [id number]   MEDICAL RECORD NO.:  [id number]                   PATIENT TYPE:  AMB   LOCATION:  DSC                                  FACILITY:  [facility]   PHYSICIAN:  Leonie Man, M.D.                DATE OF BIRTH:  1955-12-31   DATE OF PROCEDURE:  05/11/2004  DATE OF DISCHARGE:                                 OPERATIVE REPORT   PREOPERATIVE DIAGNOSIS:  Port-A-Cath for removal.   POSTOPERATIVE DIAGNOSIS:  Port-A-Cath for removal.   OPERATION PERFORMED:  Removal of Port-A-Cath.   SURGEON:  Leonie Man, M.D.   ASSISTANT:  None.   ANESTHESIA:  Local.  I used 0.5% Marcaine with epinephrine 1:200,000.   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  She presents now for removal of her Port-A-Cath.   DESCRIPTION OF PROCEDURE:  The patient was positioned supinely and the  region over the Port-A-Cath and the right shoulder was prepped and draped to  be included in a sterile operative field.  In infiltrated the area around  the incision and the region around the pocket with 0.5% Marcaine with  epinephrine.  Transverse incision opening up the old incision was deepened  through the skin and subcutaneous tissues carried down to the pocket.  The  sutures holding the Port-A-Cath were then serially clipped and the Port-A-  Cath was extruded from the pocket.  The tunnel was closed with a single  suture of 3-0 Vicryl.  Hemostasis was assured and the subcutaneous tissues  were then closed with interrupted 3-0 Vicryl sutures.  Skin closed with 4-0  Monocryl suture and then reinforced with Steri-Strips.  Sterile dressings  applied.  The patient tolerated the procedure well.                                               Leonie Man, M.D.    PB/MEDQ  D:  05/11/2004  T:  05/12/2004  Job:  [id number]

## 2011-04-06 NOTE — Op Note (Signed)
NAME:  Courtney Keller, [name]                         ACCOUNT NO.:  [id number]   MEDICAL RECORD NO.:  [id number]                   PATIENT TYPE:  INP   LOCATION:  9324                                 [facility]ACILITY[facility]   PHYSICIAN:  Lindaann Slough, M.D.               DATE OF BIRTH:  12-27-1955   DATE OF PROCEDURE:  06/14/2003  DATE OF DISCHARGE:                                 OPERATIVE REPORT   PREOPERATIVE DIAGNOSIS:  Uterine fibroids.   POSTOPERATIVE DIAGNOSIS:  Uterine fibroids.   PROCEDURE:  Cystoscopy, insertion of bilateral ureteral catheters.   SURGEON:  Lindaann Slough, M.D.   ANESTHESIA:  General anesthesia.   INDICATIONS FOR PROCEDURE:  The patient is a 56 year old female who is  scheduled for an abdominal hysterectomy.  She has had three previous  myomectomies and Dr. Stefano Gaul requested that I insert ureteral catheters  before the hysterectomy.   DESCRIPTION OF PROCEDURE:  Under general anesthesia, the patient was prepped  and draped and placed in the dorsal lithotomy position.  A #23 Wappler  cystoscope was inserted in the bladder.  The bladder mucosa is normal.  There is no stone or tumor in the bladder.  The ureteral orifices are in  normal position and shape with clear efflux.  A Glidewire was then passed  through an open end catheter and passed through the cystoscope and into the  right ureteral orifice.  The Glidewire was advanced in the ureter and the  open end catheter was passed over the Glidewire into the renal pelvis.  The  same procedure was done on the left side.   The cystoscope was then removed.  A #16 Foley catheter was then passed in  the bladder.  The patient was then left in dorsal lithotomy position for  hysterectomy by Dr. Stefano Gaul.   I was also asked by Dr. Stefano Gaul to help him with dissection of the bladder  off the uterus and ureterolysis.   The patient had multiple adhesions between the abdominal wall and small-  bowel.  A consultation with Dr.  Lurene Shadow was requested and he came in and  helped with dissection of the small intestine from the abdominal wall and  lysis of adhesions.  During the hysterectomy, the bladder was dissected from  the anterior wall of the uterus and the ureters were also identified and  freed from the surrounding tissues without any injury to either the bladder  or the ureters.  After the hysterectomy, the bladder was filled with sterile  milk and there was no evidence of bladder injury or any leak.   The rest of the procedure was carried out by Dr. Lurene Shadow and Dr. Stefano Gaul.  Lindaann Slough, M.D.   MN/MEDQ  D:  06/14/2003  T:  06/14/2003  Job:  [id number]

## 2011-08-14 ENCOUNTER — Other Ambulatory Visit: Payer: Self-pay | Admitting: Oncology

## 2011-08-14 DIAGNOSIS — Z9889 Other specified postprocedural states: Secondary | ICD-10-CM

## 2011-09-20 ENCOUNTER — Emergency Department (HOSPITAL_BASED_OUTPATIENT_CLINIC_OR_DEPARTMENT_OTHER)
Admission: EM | Admit: 2011-09-20 | Discharge: 2011-09-20 | Disposition: A | Discharge status: Home / Self Care | Payer: No Typology Code available for payment source | Attending: Emergency Medicine | Admitting: Emergency Medicine

## 2011-09-20 ENCOUNTER — Encounter: Payer: Self-pay | Admitting: *Deleted

## 2011-09-20 DIAGNOSIS — Z043 Encounter for examination and observation following other accident: Secondary | ICD-10-CM | POA: Insufficient documentation

## 2011-09-20 HISTORY — DX: Essential (primary) hypertension: I10

## 2011-09-20 MED ORDER — HYDROCODONE-ACETAMINOPHEN 5-500 MG PO TABS: 1.0000 | ORAL_TABLET | Freq: Four times a day (QID) | ORAL | Status: AC | PRN
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
## 2011-09-24 ENCOUNTER — Inpatient Hospital Stay: Admission: RE | Admit: 2011-09-24 | Payer: Self-pay | Source: Ambulatory Visit

## 2011-10-01 ENCOUNTER — Other Ambulatory Visit: Payer: Self-pay | Admitting: Oncology

## 2011-10-01 ENCOUNTER — Other Ambulatory Visit (HOSPITAL_BASED_OUTPATIENT_CLINIC_OR_DEPARTMENT_OTHER): Payer: BC Managed Care – PPO | Admitting: Lab

## 2011-10-01 DIAGNOSIS — Z170 Estrogen receptor positive status [ER+]: Secondary | ICD-10-CM

## 2011-10-01 DIAGNOSIS — C50319 Malignant neoplasm of lower-inner quadrant of unspecified female breast: Secondary | ICD-10-CM

## 2011-10-01 DIAGNOSIS — C773 Secondary and unspecified malignant neoplasm of axilla and upper limb lymph nodes: Secondary | ICD-10-CM

## 2011-10-01 LAB — COMPREHENSIVE METABOLIC PANEL
Alkaline Phosphatase: 47 U/L (ref 39–117)
CO2: 25 mEq/L (ref 19–32)
Creatinine, Ser: 0.7 mg/dL (ref 0.50–1.10)
Glucose, Bld: 88 mg/dL (ref 70–99)
Sodium: 141 mEq/L (ref 135–145)
Total Bilirubin: 0.4 mg/dL (ref 0.3–1.2)
Total Protein: 7.3 g/dL (ref 6.0–8.3)

## 2011-10-01 LAB — CBC WITH DIFFERENTIAL/PLATELET
BASO%: 0.7 % (ref 0.0–2.0)
LYMPH%: 43.4 % (ref 14.0–49.7)
MCH: 29.9 pg (ref 25.1–34.0)
MCHC: 33 g/dL (ref 31.5–36.0)
MCV: 90.5 fL (ref 79.5–101.0)
MONO%: 5 % (ref 0.0–14.0)
Platelets: 238 10*3/uL (ref 145–400)
RBC: 4.12 10*6/uL (ref 3.70–5.45)

## 2011-10-01 LAB — VITAMIN D 25 HYDROXY (VIT D DEFICIENCY, FRACTURES): Vit D, 25-Hydroxy: 34 ng/mL (ref 30–89)

## 2011-10-01 LAB — CANCER ANTIGEN 27.29: CA 27.29: 26 U/mL (ref 0–39)

## 2011-10-05 ENCOUNTER — Other Ambulatory Visit: Payer: Self-pay | Admitting: Oncology

## 2011-10-05 ENCOUNTER — Encounter: Payer: Self-pay | Admitting: *Deleted

## 2011-10-08 ENCOUNTER — Ambulatory Visit (HOSPITAL_BASED_OUTPATIENT_CLINIC_OR_DEPARTMENT_OTHER): Payer: No Typology Code available for payment source | Admitting: Oncology

## 2011-10-08 ENCOUNTER — Telehealth: Payer: Self-pay | Admitting: Oncology

## 2011-10-08 VITALS — BP 136/88 | HR 87 | Temp 98.3°F | Ht 63.5 in | Wt 164.5 lb

## 2011-10-08 DIAGNOSIS — Z170 Estrogen receptor positive status [ER+]: Secondary | ICD-10-CM

## 2011-10-08 DIAGNOSIS — C50919 Malignant neoplasm of unspecified site of unspecified female breast: Secondary | ICD-10-CM

## 2011-10-08 DIAGNOSIS — C50319 Malignant neoplasm of lower-inner quadrant of unspecified female breast: Secondary | ICD-10-CM

## 2011-10-08 NOTE — Progress Notes (Signed)
ID: Courtney Keller   Interval History: [name] returns today for followup of her breast cancer. The interval history is unremarkable. 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 Her daughter is a Holiday representative in high school and beginning to think about colleges.  ROS: She is tolerating tamoxifen with no side effects that she is aware of she is using Vagifem suppositories which is really helped with the vaginal dryness issue. Otherwise detailed review of systems was noncontributory  Medications: I have reviewed the patient's current medications.   Current Outpatient Prescriptions  Medication Sig Dispense Refill  . aspirin EC 325 MG tablet Take 325 mg by mouth daily.        . benazepril-hydrochlorthiazide (LOTENSIN HCT) 10-12.5 MG per tablet Take 1 tablet by mouth daily.        . calcium carbonate (OS-CAL) 600 MG TABS Take 600 mg by mouth 2 (two) times daily with a meal.        . estradiol (VAGIFEM) 25 MCG vaginal tablet Place 25 mcg vaginally 2 (two) times a week.        . tamoxifen (NOLVADEX) 20 MG tablet TAKE 1 TABLET EVERY DAY  90 tablet  2     Objective: Vital signs in last 24 hours: BP 136/88  Pulse 87  Temp 98.3 F (36.8 C)  Ht 5' 3.5" (1.613 m)  Wt 164 lb 8 oz (74.617 kg)  BMI 28.68 kg/m2   Physical Exam:    Sclerae unicteric  Oropharynx clear  No peripheral adenopathy  Lungs clear -- no rales or rhonchi  Heart regular rate and rhythm  Abdomen benign  MSK no focal spinal tenderness, no peripheral edema  Neuro nonfocal  Breast exam: Right breast no suspicious findings Lab Results:   CMP  Lab Results  Component Value Date   GLUCOSE 88 10/01/2011   GLUCOSE 88 10/01/2011   GLUCOSE 88 10/01/2011   ALT 12 10/01/2011   ALT 12 10/01/2011   ALT 12 10/01/2011   AST 23 10/01/2011   AST 23 10/01/2011   AST 23 10/01/2011   NA 141 10/01/2011   NA 141 10/01/2011   NA 141 10/01/2011   K 3.8 10/01/2011   K 3.8 10/01/2011   K 3.8 10/01/2011   CL 103 10/01/2011   CL 103 10/01/2011   CL 103 10/01/2011   CREATININE 0.70 10/01/2011   CREATININE 0.70 10/01/2011   CREATININE 0.70 10/01/2011   BUN 11 10/01/2011   BUN 11 10/01/2011   BUN 11 10/01/2011   CO2 25 10/01/2011   CO2 25 10/01/2011   CO2 25 10/01/2011     Lab Results  Component Value Date   WBC 5.4 10/01/2011   HGB 12.3 10/01/2011   HCT 37.3 10/01/2011   MCV 90.5 10/01/2011   PLT 238 10/01/2011        Studies/Results: Mammogram is due tomorrow.  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 She took  Anastrozole between August of 2005 and October of 2010 at which time she was switched to tamoxifen.   Plan: The plan is to continue tamoxifen to a total of 5 years. She will have  a mammogram tomorrow and we will review those results. She will see me again in one year and we will make sure she has her next mammogram before the visit. She knows to call for any problems that may develop before then.  Ranen Doolin C 10/08/2011

## 2011-10-08 NOTE — Telephone Encounter (Signed)
Gv pt appt for dec2012 °

## 2011-10-09 ENCOUNTER — Ambulatory Visit
Admission: RE | Admit: 2011-10-09 | Discharge: 2011-10-09 | Disposition: A | Discharge status: Home / Self Care | Payer: No Typology Code available for payment source | Source: Ambulatory Visit | Attending: Oncology | Admitting: Oncology

## 2011-10-09 DIAGNOSIS — Z9889 Other specified postprocedural states: Secondary | ICD-10-CM

## 2012-08-18 ENCOUNTER — Telehealth: Payer: Self-pay | Admitting: Obstetrics and Gynecology

## 2012-08-18 NOTE — Telephone Encounter (Signed)
rx refill was not authorized at this time pt needs to contact our office to schedule an aex.

## 2012-09-15 ENCOUNTER — Other Ambulatory Visit: Payer: Self-pay | Admitting: Oncology

## 2012-09-15 DIAGNOSIS — Z1231 Encounter for screening mammogram for malignant neoplasm of breast: Secondary | ICD-10-CM

## 2012-09-15 DIAGNOSIS — Z853 Personal history of malignant neoplasm of breast: Secondary | ICD-10-CM

## 2012-10-09 ENCOUNTER — Ambulatory Visit
Admission: RE | Admit: 2012-10-09 | Discharge: 2012-10-09 | Disposition: A | Discharge status: Home / Self Care | Payer: BC Managed Care – PPO | Source: Ambulatory Visit | Attending: Oncology | Admitting: Oncology

## 2012-10-09 DIAGNOSIS — Z853 Personal history of malignant neoplasm of breast: Secondary | ICD-10-CM

## 2012-10-09 DIAGNOSIS — Z1231 Encounter for screening mammogram for malignant neoplasm of breast: Secondary | ICD-10-CM

## 2012-10-20 ENCOUNTER — Other Ambulatory Visit (HOSPITAL_BASED_OUTPATIENT_CLINIC_OR_DEPARTMENT_OTHER): Payer: BC Managed Care – PPO | Admitting: Lab

## 2012-10-20 DIAGNOSIS — C50319 Malignant neoplasm of lower-inner quadrant of unspecified female breast: Secondary | ICD-10-CM

## 2012-10-20 DIAGNOSIS — C50919 Malignant neoplasm of unspecified site of unspecified female breast: Secondary | ICD-10-CM

## 2012-10-20 LAB — COMPREHENSIVE METABOLIC PANEL (CC13)
ALT: 12 U/L (ref 0–55)
Albumin: 3.5 g/dL (ref 3.5–5.0)
CO2: 27 mEq/L (ref 22–29)
Calcium: 9.6 mg/dL (ref 8.4–10.4)
Chloride: 104 mEq/L (ref 98–107)
Glucose: 90 mg/dl (ref 70–99)
Potassium: 3.7 mEq/L (ref 3.5–5.1)
Sodium: 143 mEq/L (ref 136–145)
Total Bilirubin: 0.55 mg/dL (ref 0.20–1.20)
Total Protein: 7 g/dL (ref 6.4–8.3)

## 2012-10-20 LAB — CBC WITH DIFFERENTIAL/PLATELET
EOS%: 6 % (ref 0.0–7.0)
Eosinophils Absolute: 0.4 10*3/uL (ref 0.0–0.5)
LYMPH%: 46.3 % (ref 14.0–49.7)
MCH: 29.8 pg (ref 25.1–34.0)
MCHC: 32.7 g/dL (ref 31.5–36.0)
MCV: 91.1 fL (ref 79.5–101.0)
MONO%: 7.6 % (ref 0.0–14.0)
Platelets: 208 10*3/uL (ref 145–400)
RBC: 3.83 10*6/uL (ref 3.70–5.45)
RDW: 13.1 % (ref 11.2–14.5)

## 2012-10-20 LAB — CANCER ANTIGEN 27.29: CA 27.29: 23 U/mL (ref 0–39)

## 2012-10-27 ENCOUNTER — Ambulatory Visit (HOSPITAL_BASED_OUTPATIENT_CLINIC_OR_DEPARTMENT_OTHER): Payer: BC Managed Care – PPO | Admitting: Oncology

## 2012-10-27 ENCOUNTER — Telehealth: Payer: Self-pay | Admitting: *Deleted

## 2012-10-27 VITALS — BP 145/89 | HR 91 | Temp 97.8°F | Resp 20 | Ht 63.5 in | Wt 170.6 lb

## 2012-10-27 DIAGNOSIS — Z170 Estrogen receptor positive status [ER+]: Secondary | ICD-10-CM

## 2012-10-27 DIAGNOSIS — C50319 Malignant neoplasm of lower-inner quadrant of unspecified female breast: Secondary | ICD-10-CM

## 2012-10-27 DIAGNOSIS — C50919 Malignant neoplasm of unspecified site of unspecified female breast: Secondary | ICD-10-CM

## 2012-10-27 MED ORDER — TAMOXIFEN CITRATE 20 MG PO TABS: 20.0000 mg | ORAL_TABLET | Freq: Every day | ORAL | Status: DC

## 2012-10-27 NOTE — Progress Notes (Signed)
ID: NAILEAH KARG   DOB: [birth year]  MR#: [id number]  CSN#:[id number]  PCP: Dorothyann Peng GYN:  SU:  OTHER MD:   HISTORY OF PRESENT ILLNESS: The patient tells me she had a negative mammogram 03/2003.  More recently she noticed a difference, or a mass, in the lower inner quadrant of the left breast.  She brought this to Dr. Debria Garret attention and he set [facility].  This showed a solid mass in the lower inner quadrant, which was palpable and by ultrasound was irregular, measuring up to 1.9 cm.  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  With this information, the patient was referred to Dr. Lurene Shadow and after appropriate discussion she underwent left lumpectomy with sentinel lymph node biopsy 11/29/03.  Unfortunately, because the three sentinel lymph nodes were positive, she had a full axillary lymph node dissection with a total of five out of 13 lymph  nodes involved by the tumor, which measured 2.5 cm.  Margins were negative.  Grade was 1.  There was no evidence of lymphovascular invasion. Her subsequent history is as detailed below  INTERVAL HISTORY: The patient returns today for routine followup of her breast cancer. The interval history is unremarkable. She is exercising with a treadmill at home. Since her last visit here her mother, currently 41 years old, was diagnosed with breast cancer. She had a mastectomy at [facility]. The patient does not yet know all the details, but "she is doing well".  REVIEW OF SYSTEMS: Joint is tolerating the tamoxifen without any significant side effects. She has no problems with hot flashes. There is minimal vaginal dryness, and she is aware that she can use vaginal estrogen preparations while on tamoxifen, but "that's fluoride". A detailed review of systems today was otherwise entirely negative.  PAST MEDICAL HISTORY: Past  Medical History  Diagnosis Date  . Hypertension     PAST SURGICAL HISTORY: Past Surgical History  Procedure Date  . Abdominal hysterectomy   . Breast lumpectomy   She is status post myomectomy x 3, and status post TAH/BSO under Dr. Stefano Gaul  FAMILY HISTORY No family history on file. The patient does not know much about her father, but she believes that he has died.  She does not know the cause.  The patient's mother is alive and was diagnosed with breast cancer at age 67 [facility]) .  The patient has one brother in good health.  The only other history of breast cancer is a cousin of the patient's mother who had breast cancer diagnosed in her 30s.  GYNECOLOGIC HISTORY: GX P0  SOCIAL HISTORY: She works part-time in the administration at [employer]  Her husband Jonny Ruiz is retired. They moved here from [place]. Jonny Ruiz has a daughter from a prior marriage, Cristy Friedlander, who lives in [place].  She had a son who was murdered many years ago, and has a granddaughter through him, as well as two other grandchildren, including one which is an adopted child of Florence's.  John and [place] have adopted a girl, Jola Babinski.  They are Baptists.   ADVANCED DIRECTIVES:  HEALTH MAINTENANCE: History  Substance Use Topics  . Smoking status: Never Smoker   . Smokeless tobacco: Not on file  . Alcohol Use: No     Colonoscopy:  PAP:  Bone density:  Lipid panel:  No Known Allergies  Current Outpatient Prescriptions  Medication Sig Dispense  Refill  . aspirin EC 325 MG tablet Take 325 mg by mouth daily.        . benazepril-hydrochlorthiazide (LOTENSIN HCT) 10-12.5 MG per tablet Take 1 tablet by mouth daily.        . calcium carbonate (OS-CAL) 600 MG TABS Take 600 mg by mouth 2 (two) times daily with a meal.        . tamoxifen (NOLVADEX) 20 MG tablet TAKE 1 TABLET EVERY DAY  90 tablet  2  . estradiol (VAGIFEM) 25 MCG vaginal tablet Place 25 mcg vaginally 2 (two) times a week.          OBJECTIVE: Middle-aged  [place] woman who appears healthy Filed Vitals:   10/27/12 0921  BP: 145/89  Pulse: 91  Temp: 97.8 F (36.6 C)  Resp: 20     Body mass index is 29.75 kg/(m^2).    ECOG FS: 0  Sclerae unicteric Oropharynx clear No cervical or supraclavicular adenopathy Lungs no rales or rhonchi Heart regular rate and rhythm Abd benign MSK no focal spinal tenderness, no peripheral edema Neuro: nonfocal Breasts: The right breast is unremarkable. The left breast is status post lumpectomy and radiation. There is no evidence of local recurrence. The left axilla is benign.   LAB RESULTS: Lab Results  Component Value Date   WBC 5.8 10/20/2012   NEUTROABS 2.3 10/20/2012   HGB 11.4* 10/20/2012   HCT 34.9 10/20/2012   MCV 91.1 10/20/2012   PLT 208 10/20/2012      Chemistry      Component Value Date/Time   NA 143 10/20/2012 0823   NA 141 10/01/2011 1341   NA 141 10/01/2011 1341   NA 141 10/01/2011 1341   K 3.7 10/20/2012 0823   K 3.8 10/01/2011 1341   K 3.8 10/01/2011 1341   K 3.8 10/01/2011 1341   CL 104 10/20/2012 0823   CL 103 10/01/2011 1341   CL 103 10/01/2011 1341   CL 103 10/01/2011 1341   CO2 27 10/20/2012 0823   CO2 25 10/01/2011 1341   CO2 25 10/01/2011 1341   CO2 25 10/01/2011 1341   BUN 17.0 10/20/2012 0823   BUN 11 10/01/2011 1341   BUN 11 10/01/2011 1341   BUN 11 10/01/2011 1341   CREATININE 0.8 10/20/2012 0823   CREATININE 0.70 10/01/2011 1341   CREATININE 0.70 10/01/2011 1341   CREATININE 0.70 10/01/2011 1341      Component Value Date/Time   CALCIUM 9.6 10/20/2012 0823   CALCIUM 9.6 10/01/2011 1341   CALCIUM 9.6 10/01/2011 1341   CALCIUM 9.6 10/01/2011 1341   ALKPHOS 44 10/20/2012 0823   ALKPHOS 47 10/01/2011 1341   ALKPHOS 47 10/01/2011 1341   ALKPHOS 47 10/01/2011 1341   AST 21 10/20/2012 0823   AST 23 10/01/2011 1341   AST 23 10/01/2011 1341   AST 23 10/01/2011 1341   ALT 12 10/20/2012 0823   ALT 12 10/01/2011 1341   ALT 12 10/01/2011 1341   ALT 12  10/01/2011 1341   BILITOT 0.55 10/20/2012 0823   BILITOT 0.4 10/01/2011 1341   BILITOT 0.4 10/01/2011 1341   BILITOT 0.4 10/01/2011 1341       Lab Results  Component Value Date   LABCA2 23 10/20/2012    No components found with this basename: [id number]    No results found for this basename: INR:1;PROTIME:1 in the last 168 hours  Urinalysis No results found for this basename: colorurine, appearanceur, labspec, phurine, glucoseu, hgbur, bilirubinur, ketonesur,  proteinur, urobilinogen, nitrite, leukocytesur    STUDIES: Mm Digital Screening  10/09/2012  *RADIOLOGY REPORT*  Clinical Data: Screening. History of left lumpectomy 2005.  DIGITAL BILATERAL SCREENING MAMMOGRAM WITH CAD  Comparison:  Previous exams.  Findings: The breast tissue is extremely dense. No suspicious masses, architectural distortion, or calcifications are present. Postoperative changes are seen in the left breast.  Images were processed with CAD.  IMPRESSION: No mammographic evidence of malignancy.  A result letter of this screening mammogram will be mailed directly to the patient.  RECOMMENDATION: Screening mammogram in one year. (Code:SM-B-01Y)  BI-RADS CATEGORY 1:  Negative.   Original Report Authenticated By: Norva Pavlov, M.D.     ASSESSMENT: 56 y.o. 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  She underwent TAH-BSO in 2004.  She started Arimidex in August of 2005 and switched to tamoxifen after a long discussion October of 2010  PLAN: She has 2 more years to go on the tamoxifen. She will see [place] again December of 2014, and then for a final visit December of 2015. She knows to call for any problems that may develop before the next  visit.   MAGRINAT,GUSTAV C    10/27/2012

## 2012-10-27 NOTE — Telephone Encounter (Signed)
Gave patient appointment for 10-20-2013 lab only appointment 10-27-2013 md only

## 2012-11-20 ENCOUNTER — Other Ambulatory Visit: Payer: Self-pay | Admitting: Oncology

## 2013-09-04 ENCOUNTER — Ambulatory Visit (INDEPENDENT_AMBULATORY_CARE_PROVIDER_SITE_OTHER): Payer: PRIVATE HEALTH INSURANCE | Admitting: Family

## 2013-09-04 ENCOUNTER — Encounter: Payer: Self-pay | Admitting: Family

## 2013-09-04 VITALS — BP 160/100 | HR 78 | Temp 98.5°F | Resp 16 | Ht 64.0 in | Wt 170.1 lb

## 2013-09-04 DIAGNOSIS — C50919 Malignant neoplasm of unspecified site of unspecified female breast: Secondary | ICD-10-CM

## 2013-09-04 DIAGNOSIS — I10 Essential (primary) hypertension: Secondary | ICD-10-CM

## 2013-09-04 DIAGNOSIS — C50912 Malignant neoplasm of unspecified site of left female breast: Secondary | ICD-10-CM

## 2013-09-04 LAB — BASIC METABOLIC PANEL
Calcium: 9.7 mg/dL (ref 8.4–10.5)
Glucose, Bld: 88 mg/dL (ref 70–99)
Potassium: 4.6 mEq/L (ref 3.5–5.3)
Sodium: 141 mEq/L (ref 135–145)

## 2013-09-04 MED ORDER — BENAZEPRIL-HYDROCHLOROTHIAZIDE 10-12.5 MG PO TABS: 1.0000 | ORAL_TABLET | Freq: Every day | ORAL | Status: DC

## 2013-09-04 NOTE — Assessment & Plan Note (Signed)
Deteriorated.  Ran out of meds.  Resume lotensin- hct. Obtain bmet.

## 2013-09-04 NOTE — Assessment & Plan Note (Signed)
Followed by Dr. Darnelle Catalan. Has upcoming mammogram in November.

## 2013-09-04 NOTE — Patient Instructions (Signed)
Please restart blood pressure medication. Complete lab work prior to leaving. Follow up in 3 months.  Welcome to [facility]!

## 2013-09-04 NOTE — Progress Notes (Signed)
Subjective:    Patient ID: Courtney Keller, female    DOB: 11/12/1956, 57 y.o.   MRN: [id number]  HPI  Courtney Keller is a 57 yr old female who presents today to establish care. Pmhx is significant for:  HTN-  Ran out a few weeks ago. Was diagnosed with htn in her late 57's. Reports that her pressure was well controlled before she ran out.   Breast cancer- diagnosed 10/2005. She had a TAH/BSO earlier that year and was subsequently placed on estrogen therapy.  Later that year she was diagnosed with breast cancer.  She is s/p lumpectomy/radiation/chemo to the left breast. Took arimidex x 5 years and now on tamoxifen.  Last mammogram was last November and she is scheduled for follow up next month. Follows with Dr. Darnelle Catalan- oncology.  Review of Systems  Constitutional:       Has gained 8 pounds in last 6 month.  Has not been exercising  HENT: Negative for rhinorrhea.   Eyes: Negative for visual disturbance.  Respiratory: Negative for cough and shortness of breath.   Cardiovascular: Negative for chest pain and leg swelling.  Gastrointestinal: Negative for nausea, vomiting, abdominal pain and diarrhea.  Genitourinary: Negative for dysuria and frequency.  Musculoskeletal: Negative for arthralgias and myalgias.  Skin: Negative for rash.  Neurological: Negative for headaches.  Hematological: Negative for adenopathy.  Psychiatric/Behavioral:       Denies depression/anxiety   Past Medical History  Diagnosis Date  . Hypertension   . Breast cancer     History   Social History  . Marital Status: Married    Spouse Name: N/A    Number of Children: N/A  . Years of Education: N/A   Occupational History  . Not on file.   Social History Main Topics  . Smoking status: Never Smoker   . Smokeless tobacco: Not on file  . Alcohol Use: 3.0 oz/week    5 Glasses of wine per week  . Drug Use: No  . Sexual Activity: Not on file   Other Topics Concern  . Not on file   Social History Narrative    1 daughter- Jola Babinski- born 60.   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 Breast lumpectomy Left 2006  . Abdominal hysterectomy  2006    Family History  Problem Relation Age of Onset  . Hypertension Mother   . Cancer Mother 73    breast cancer  . Hypertension Maternal Grandmother     No Known Allergies  Current Outpatient Prescriptions on File Prior to Visit  Medication Sig Dispense Refill  . calcium carbonate (OS-CAL) 600 MG TABS Take 600 mg by mouth 2 (two) times daily with a meal.        . tamoxifen (NOLVADEX) 20 MG tablet TAKE 1 TABLET BY MOUTH DAILY  90 tablet  3   No current facility-administered medications on file prior to visit.    BP 160/100  Pulse 78  Temp(Src) 98.5 F (36.9 C) (Oral)  Resp 16  Ht 5\' 4"  (1.626 m)  Wt 170 lb 1.9 oz (77.166 kg)  BMI 29.19 kg/m2  SpO2 99%       Objective:   Physical Exam  Constitutional: She is oriented to person, place, and time. She appears well-developed and well-nourished. No distress.  HENT:  Head: Normocephalic and atraumatic.  Right Ear: Tympanic membrane and ear canal normal.  Left Ear: Tympanic membrane and ear canal normal.  Mouth/Throat: No oropharyngeal exudate, posterior oropharyngeal edema or posterior oropharyngeal erythema.  Cardiovascular: Normal rate and regular rhythm.   No murmur heard. Pulmonary/Chest: Effort normal and breath sounds normal. No respiratory distress. She has no wheezes. She has no rales. She exhibits no tenderness.  Lymphadenopathy:    She has no cervical adenopathy.  Neurological: She is alert and oriented to person, place, and time.  Skin: Skin is warm and dry. No rash noted. No erythema. No pallor.  Psychiatric: She has a normal mood and affect. Her behavior is normal. Judgment and thought content normal.          Assessment &  Plan:

## 2013-09-07 ENCOUNTER — Encounter: Payer: Self-pay | Admitting: Family

## 2013-09-29 ENCOUNTER — Other Ambulatory Visit: Payer: Self-pay

## 2013-09-29 DIAGNOSIS — Z1231 Encounter for screening mammogram for malignant neoplasm of breast: Secondary | ICD-10-CM

## 2013-09-29 DIAGNOSIS — Z853 Personal history of malignant neoplasm of breast: Secondary | ICD-10-CM

## 2013-10-06 ENCOUNTER — Telehealth: Payer: Self-pay | Admitting: Oncology

## 2013-10-06 NOTE — Telephone Encounter (Signed)
per GM moved 12/9 appt from GM to AB. lmonvm for pt re change w/new time for 12/9 f/u. other appts remain the same. schedule mailed.

## 2013-10-20 ENCOUNTER — Other Ambulatory Visit (HOSPITAL_BASED_OUTPATIENT_CLINIC_OR_DEPARTMENT_OTHER): Payer: PRIVATE HEALTH INSURANCE | Admitting: Lab

## 2013-10-20 ENCOUNTER — Encounter (INDEPENDENT_AMBULATORY_CARE_PROVIDER_SITE_OTHER): Payer: Self-pay

## 2013-10-20 DIAGNOSIS — C50919 Malignant neoplasm of unspecified site of unspecified female breast: Secondary | ICD-10-CM

## 2013-10-20 DIAGNOSIS — C50319 Malignant neoplasm of lower-inner quadrant of unspecified female breast: Secondary | ICD-10-CM

## 2013-10-20 LAB — CBC WITH DIFFERENTIAL/PLATELET
BASO%: 0.8 % (ref 0.0–2.0)
Basophils Absolute: 0 10*3/uL (ref 0.0–0.1)
HGB: 11.7 g/dL (ref 11.6–15.9)
LYMPH%: 43.9 % (ref 14.0–49.7)
MCH: 30.4 pg (ref 25.1–34.0)
MCHC: 32.6 g/dL (ref 31.5–36.0)
MCV: 93 fL (ref 79.5–101.0)
MONO%: 9.4 % (ref 0.0–14.0)
Platelets: 235 10*3/uL (ref 145–400)
RBC: 3.85 10*6/uL (ref 3.70–5.45)

## 2013-10-20 LAB — COMPREHENSIVE METABOLIC PANEL (CC13)
ALT: 11 U/L (ref 0–55)
Alkaline Phosphatase: 42 U/L (ref 40–150)
Creatinine: 0.8 mg/dL (ref 0.6–1.1)
Sodium: 141 mEq/L (ref 136–145)
Total Bilirubin: 0.4 mg/dL (ref 0.20–1.20)
Total Protein: 7.3 g/dL (ref 6.4–8.3)

## 2013-10-23 ENCOUNTER — Ambulatory Visit
Admission: RE | Admit: 2013-10-23 | Discharge: 2013-10-23 | Disposition: A | Discharge status: Home / Self Care | Payer: BC Managed Care – PPO | Source: Ambulatory Visit

## 2013-10-23 DIAGNOSIS — Z853 Personal history of malignant neoplasm of breast: Secondary | ICD-10-CM

## 2013-10-23 DIAGNOSIS — Z1231 Encounter for screening mammogram for malignant neoplasm of breast: Secondary | ICD-10-CM

## 2013-10-27 ENCOUNTER — Ambulatory Visit (HOSPITAL_BASED_OUTPATIENT_CLINIC_OR_DEPARTMENT_OTHER): Payer: No Typology Code available for payment source | Admitting: Physician Assistant

## 2013-10-27 ENCOUNTER — Telehealth: Payer: Self-pay | Admitting: Oncology

## 2013-10-27 ENCOUNTER — Encounter (INDEPENDENT_AMBULATORY_CARE_PROVIDER_SITE_OTHER): Payer: Self-pay

## 2013-10-27 ENCOUNTER — Encounter: Payer: Self-pay | Admitting: Physician Assistant

## 2013-10-27 VITALS — BP 136/85 | HR 91 | Temp 97.5°F | Resp 18 | Ht 64.0 in | Wt 170.6 lb

## 2013-10-27 DIAGNOSIS — C50319 Malignant neoplasm of lower-inner quadrant of unspecified female breast: Secondary | ICD-10-CM | POA: Diagnosis not present

## 2013-10-27 DIAGNOSIS — Z170 Estrogen receptor positive status [ER+]: Secondary | ICD-10-CM

## 2013-10-27 DIAGNOSIS — C50912 Malignant neoplasm of unspecified site of left female breast: Secondary | ICD-10-CM

## 2013-10-27 DIAGNOSIS — Z1231 Encounter for screening mammogram for malignant neoplasm of breast: Secondary | ICD-10-CM

## 2013-10-27 MED ORDER — TAMOXIFEN CITRATE 20 MG PO TABS: 20.0000 mg | ORAL_TABLET | Freq: Every day | ORAL | Status: DC

## 2013-10-27 NOTE — Telephone Encounter (Signed)
, °

## 2013-10-27 NOTE — Progress Notes (Signed)
ID: Courtney Keller   DOB: 04-05-1956  MR#: [id number]  CSN#:[id number]  PCP: Courtney Keller., NP GYN:  SU:  OTHER MD:  CHIEF COMPLAINT:  Left Breast Cancer   HISTORY OF PRESENT ILLNESS: The patient tells me she had a negative mammogram 03/2003.  More recently she noticed a difference, or a mass, in the lower inner quadrant of the left breast.  She brought this to Dr. Debria Keller attention and he [facility]  This showed a solid mass in the lower inner quadrant, which was palpable and by ultrasound was irregular, measuring up to 1.9 cm.  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  With this information, the patient was referred to Dr. Lurene Keller and after appropriate discussion she underwent left lumpectomy with sentinel lymph node biopsy 11/29/03.  Unfortunately, because the three sentinel lymph nodes were positive, she had a full axillary lymph node dissection with a total of five out of 13 lymph  nodes involved by the tumor, which measured 2.5 cm.  Margins were negative.  Grade was 1.  There was no evidence of lymphovascular invasion.   Her subsequent history is as detailed below  INTERVAL HISTORY: Courtney Keller returns alone today for routine followup of her left breast cancer. The interval history is generally unremarkable, and she has no new complaints today. She continues on tamoxifen daily with good tolerance. She's had no significant hot flashes. She's had no increased joint pain. No vaginal dryness, discharge, or abnormal bleeding.  I will mention that her mother was diagnosed little over one year ago with breast cancer at age 63 and is followed at [employer] [facility].  She is doing well, and is also on tamoxifen.  REVIEW OF SYSTEMS: Courtney Keller denies any recent illnesses and has had no fevers, chills, or night sweats. She's had no rashes or skin changes. Her energy  level is good. She tries to exercise on a regular basis.   Her appetite is good, and she denies any nausea or emesis. She occasionally has some constipation. She denies any blood in the stool. She's had no cough, shortness of breath, chest pain, or palpitations. She's had no abnormal headaches, dizziness, or change in vision. She currently denies any problems with myalgias, arthralgias, bony pain, or peripheral swelling.  A detailed review of systems is otherwise stable and noncontributory today.   PAST MEDICAL HISTORY:  (Updated December 2014)  Past Medical History  Diagnosis Date  . Hypertension   . Breast cancer   . Cancer     PAST SURGICAL HISTORY: Past Surgical History  Procedure Laterality Date  . Breast lumpectomy Left 2006  . Abdominal hysterectomy  2006  She is status post myomectomy x 3, and status post TAH/BSO under Dr. Stefano Keller  FAMILY HISTORY  (updated December 2014)  Family History  Problem Relation Age of Onset  . Hypertension Mother   . Cancer Mother 27    breast cancer  . Hypertension Maternal Grandmother    The patient does not know much about her father, but she believes that he has died.  She does not know the cause.  The patient's mother is alive and was diagnosed with breast cancer at age 53 [facility]) .  The patient has one brother in good health.  The only other history of breast cancer is a cousin of the patient's mother who had breast cancer diagnosed in her 30s.  GYNECOLOGIC HISTORY: GX P0, status post remote TAH/BS0   SOCIAL HISTORY:  (updated December 2014) She works part-time in the administration at [employer].  Her husband Courtney Keller is retired. They moved here from [place]. Courtney Keller has a daughter from a prior marriage, Courtney Keller, who lives in [place].  She had a son who was murdered many years ago, and has a granddaughter through him, as well as two other grandchildren, including one which is an adopted child of Courtney Keller's.  Courtney Keller and Courtney Keller have adopted a  girl, Courtney Keller.  They are Baptists.   ADVANCED DIRECTIVES:  HEALTH MAINTENANCE:  (updated December 2014) History  Substance Use Topics  . Smoking status: Never Smoker   . Smokeless tobacco: Never Used  . Alcohol Use: 3.0 oz/week    5 Glasses of wine per week     Colonoscopy:  Not on file  PAP:  S/p Hysterectomy  Bone density:  Nov 2011, Normal  Lipid panel:  Not on file   No Known Allergies  Current Outpatient Prescriptions  Medication Sig Dispense Refill  . aspirin 81 MG tablet Take 81 mg by mouth daily.      . benazepril-hydrochlorthiazide (LOTENSIN HCT) 10-12.5 MG per tablet Take 1 tablet by mouth daily.  90 tablet  1  . calcium carbonate (OS-CAL) 600 MG TABS Take 600 mg by mouth 2 (two) times daily with a meal.        . tamoxifen (NOLVADEX) 20 MG tablet Take 1 tablet (20 mg total) by mouth daily.  90 tablet  3   No current facility-administered medications for this visit.    OBJECTIVE: Middle-aged [place] American woman who appears healthy Filed Vitals:   10/27/13 1510  BP: 136/85  Pulse: 91  Temp: 97.5 F (36.4 C)  Resp: 18     Body mass index is 29.26 kg/(m^2).    ECOG FS: 0 Filed Weights   10/27/13 1510  Weight: 170 lb 9 oz (77.367 kg)   Physical Exam: HEENT:  Sclerae anicteric.  Oropharynx clear and moist.  NODES:  No cervical or supraclavicular lymphadenopathy palpated.  BREAST EXAM:  Right breast is unremarkable. Left breast is status post lumpectomy. No suspicious nodularities or skin changes, no evidence of local recurrence. Axillae are benign bilaterally, with no palpable lymphadenopathy. LUNGS:  Clear to auscultation bilaterally.  No wheezes or rhonchi HEART:  Regular rate and rhythm. No murmur  ABDOMEN:  Soft, nontender to palpation.  Positive bowel sounds.  MSK:  No focal spinal tenderness to palpation. Good range of motion bilaterally in the upper extremities. EXTREMITIES:  No peripheral edema.   SKIN:  Benign.  No rashes or skin changes. No nail  dyscrasia. NEURO:  Nonfocal. Well oriented.  Positive affect.     LAB RESULTS: Lab Results  Component Value Date   WBC 5.1 10/20/2013   NEUTROABS 2.0 10/20/2013   HGB 11.7 10/20/2013   HCT 35.8 10/20/2013   MCV 93.0 10/20/2013   PLT 235 10/20/2013      Chemistry      Component Value Date/Time   NA 141 10/20/2013 0859   NA 141 09/04/2013 1046   K 4.0 10/20/2013 0859   K 4.6 09/04/2013 1046   CL 103 09/04/2013 1046   CL 104 10/20/2012 0823   CO2 25 10/20/2013 0859   CO2 27 09/04/2013 1046   BUN 16.4 10/20/2013 0859   BUN 11 09/04/2013 1046   CREATININE 0.8 10/20/2013 0859   CREATININE 0.77 09/04/2013 1046   CREATININE 0.70 10/01/2011  1341      Component Value Date/Time   CALCIUM 9.6 10/20/2013 0859   CALCIUM 9.7 09/04/2013 1046   ALKPHOS 42 10/20/2013 0859   ALKPHOS 47 10/01/2011 1341   AST 22 10/20/2013 0859   AST 23 10/01/2011 1341   ALT 11 10/20/2013 0859   ALT 12 10/01/2011 1341   BILITOT 0.40 10/20/2013 0859   BILITOT 0.4 10/01/2011 1341       STUDIES:  Mm Digital Screening  10/26/2013   CLINICAL DATA:  Screening.  EXAM: DIGITAL SCREENING BILATERAL MAMMOGRAM WITH CAD  COMPARISON:  Previous exam(s).  ACR Breast Density Category d: The breasts are extremely dense, which lowers the sensitivity of mammography.  FINDINGS: There are no findings suspicious for malignancy. Images were processed with CAD.  IMPRESSION: No mammographic evidence of malignancy. A result letter of this screening mammogram will be mailed directly to the patient.  RECOMMENDATION: Screening mammogram in one year. (Code:SM-B-01Y)  BI-RADS CATEGORY  2: Benign Finding(s)   Electronically Signed   By: Sherian Rein M.D.   On: 10/26/2013 09:33    ASSESSMENT: 57 y.o. 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    (3)  She underwent TAH-BSO in 2004.    (4) She started Arimidex in August of 2005 and switched to tamoxifen after a long discussion in October of 2010.  The plan is to continue tamoxifen for total of 5 years, until 2015.  PLAN:  Na appears to be doing extremely well with no clinical evidence of disease recurrence. She is tolerating the tamoxifen well, and our plan is to continue for one more year. I have refilled her prescription.  She will be due for her next annual mammogram in December 2015, and will return to see [place] soon thereafter for one last follow up visit.  If she is still doing well, we will discontinue her tamoxifen and she will "graduate" from followup at that time.  This was all reviewed with the patient today, and she voices understanding and agreement. She knows as always to call with any changes or problems prior to her next scheduled appointment.  Karyssa Amaral PA-C    10/27/2013

## 2013-11-24 ENCOUNTER — Encounter: Payer: Self-pay | Admitting: Family

## 2013-11-25 ENCOUNTER — Ambulatory Visit: Payer: PRIVATE HEALTH INSURANCE | Admitting: Family

## 2014-03-24 ENCOUNTER — Other Ambulatory Visit: Payer: Self-pay | Admitting: Family

## 2014-03-24 NOTE — Telephone Encounter (Signed)
Pharmacy requested 90 day supply of benazepril. We are only able to do 30 day supply at this time as pt was due for follow up in January and has not been seen.  Please call pt to arrange appt for further refills.

## 2014-03-25 NOTE — Telephone Encounter (Signed)
Informed patient of medication refill and she scheduled appointment for 04/07/14

## 2014-04-07 ENCOUNTER — Telehealth: Payer: Self-pay | Admitting: Family

## 2014-04-07 ENCOUNTER — Other Ambulatory Visit: Payer: Self-pay | Admitting: Family

## 2014-04-07 ENCOUNTER — Ambulatory Visit (INDEPENDENT_AMBULATORY_CARE_PROVIDER_SITE_OTHER): Payer: BC Managed Care – PPO | Admitting: Family

## 2014-04-07 ENCOUNTER — Encounter: Payer: Self-pay | Admitting: Family

## 2014-04-07 VITALS — BP 110/80 | HR 81 | Temp 98.1°F | Resp 16 | Ht 64.0 in | Wt 165.0 lb

## 2014-04-07 DIAGNOSIS — Z23 Encounter for immunization: Secondary | ICD-10-CM

## 2014-04-07 DIAGNOSIS — I10 Essential (primary) hypertension: Secondary | ICD-10-CM

## 2014-04-07 LAB — BASIC METABOLIC PANEL
BUN: 12 mg/dL (ref 6–23)
CALCIUM: 9.6 mg/dL (ref 8.4–10.5)
CO2: 29 meq/L (ref 19–32)
Chloride: 103 mEq/L (ref 96–112)
Creat: 0.68 mg/dL (ref 0.50–1.10)
GLUCOSE: 86 mg/dL (ref 70–99)
Potassium: 4.3 mEq/L (ref 3.5–5.3)
SODIUM: 139 meq/L (ref 135–145)

## 2014-04-07 MED ORDER — BENAZEPRIL-HYDROCHLOROTHIAZIDE 10-12.5 MG PO TABS: ORAL_TABLET | ORAL | Status: DC

## 2014-04-07 NOTE — Progress Notes (Signed)
   Subjective:    Patient ID: Courtney Keller, female    DOB: 1956/07/10, 58 y.o.   MRN: [id number]  HPI  Courtney Keller is a 58 yr old female who presents today for follow up.  1) HTN- currently on benazepril-hctz. Denies CP/SOB/Swelling HA.   BP Readings from Last 3 Encounters:  04/07/14 110/80  10/27/13 136/85  09/04/13 160/100   Wt Readings from Last 3 Encounters:  04/07/14 165 lb (74.844 kg)  10/27/13 170 lb 9 oz (77.367 kg)  09/04/13 170 lb 1.9 oz (77.166 kg)    Review of Systems See HPI  Past Medical History  Diagnosis Date  . Hypertension   . Breast cancer   . Cancer     History   Social History  . Marital Status: Married    Spouse Name: N/A    Number of Children: N/A  . Years of Education: N/A   Occupational History  . Not on file.   Social History Main Topics  . Smoking status: Never Smoker   . Smokeless tobacco: Never Used  . Alcohol Use: 3.0 oz/week    5 Glasses of wine per week  . Drug Use: No  . Sexual Activity: Not on file   Other Topics Concern  . Not on file   Social History Narrative   1 daughter- [name]- born 49.   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 Breast lumpectomy Left 2006  . Abdominal hysterectomy  2006    Family History  Problem Relation Age of Onset  . Hypertension Mother   . Cancer Mother 84    breast cancer  . Hypertension Maternal Grandmother     No Known Allergies  Current Outpatient Prescriptions on File Prior to Visit  Medication Sig Dispense Refill  . aspirin 81 MG tablet Take 81 mg by mouth daily.      . calcium carbonate (OS-CAL) 600 MG TABS Take 600 mg by mouth 2 (two) times daily with a meal.        . tamoxifen (NOLVADEX) 20 MG tablet Take 1 tablet (20 mg total) by mouth daily.  90 tablet  3   No current facility-administered medications on file prior to  visit.    BP 110/80  Pulse 81  Temp(Src) 98.1 F (36.7 C) (Oral)  Resp 16  Ht 5\' 4"  (1.626 m)  Wt 165 lb (74.844 kg)  BMI 28.31 kg/m2  SpO2 99%       Objective:   Physical Exam  Constitutional: She appears well-developed and well-nourished. No distress.  HENT:  Head: Normocephalic and atraumatic.  Cardiovascular: Normal rate and regular rhythm.   No murmur heard. Pulmonary/Chest: Effort normal and breath sounds normal. No respiratory distress. She has no wheezes. She has no rales. She exhibits no tenderness.  Musculoskeletal: She exhibits no edema.  Psychiatric: She has a normal mood and affect. Her behavior is normal. Judgment and thought content normal.          Assessment & Plan:

## 2014-04-07 NOTE — Progress Notes (Signed)
Pre visit review using our clinic review tool, if applicable. No additional management support is needed unless otherwise documented below in the visit note. 

## 2014-04-07 NOTE — Patient Instructions (Addendum)
Please complete lab work prior to leaving. Please schedule a physical at your convenience.

## 2014-04-07 NOTE — Telephone Encounter (Signed)
Relevant patient education mailed to patient.  

## 2014-04-08 ENCOUNTER — Encounter: Payer: Self-pay | Admitting: Family

## 2014-04-09 NOTE — Assessment & Plan Note (Signed)
BP stable on benazepril-hctz, continue same, obtain bmet.

## 2014-08-21 ENCOUNTER — Other Ambulatory Visit: Payer: Self-pay | Admitting: Family

## 2014-08-23 NOTE — Telephone Encounter (Signed)
Medication Detail      Disp Refills Start End     benazepril-hydrochlorthiazide (LOTENSIN HCT) 10-12.5 MG per tablet 30 tablet 5 04/07/2014     Sig: TAKE 1 TABLET BY MOUTH DAILY.    Notes to Pharmacy: PATIENT NEEDS OFFICE VISIT    E-Prescribing Status: Receipt confirmed by pharmacy (04/07/2014 9:08 AM EDT)    Pharmacy    [id number] - [place], [place]   Rx request Denied, too soon for request/SLS

## 2014-08-27 ENCOUNTER — Telehealth: Payer: Self-pay | Admitting: Family

## 2014-08-27 NOTE — Telephone Encounter (Signed)
Caller name: Adabelle Relation to pt: self Call back number: [phone] [facility] [address]  Reason for call:   Patient is requesting refill of hctz

## 2014-08-27 NOTE — Telephone Encounter (Signed)
Benazepril - HCTZ was sent to pharmacy on 04/07/14, #30 x 5 refills. If this is the medication pt is requesting she should still have refill remaining at the pharmacy. Left message for pt to return my call for clarification.

## 2014-08-31 NOTE — Telephone Encounter (Signed)
Left message for pt to return my call.

## 2014-08-31 NOTE — Telephone Encounter (Signed)
Spoke with pt. She states she is taking "whatever we prescribed her last" and the pharmacy told her there were no refills remaining.  Advised pt that our record indicates she should be taking benazepril HCTZ. Spoke with pharmacist and verified that pt last filled Rx on 06/13/14 for #30 and has 4 refills remaining on the Rx. Notified pt and advised her to contact the pharmacy instead of requesting refill from Rx # on current bottle. Pt voices understanding.

## 2014-09-20 ENCOUNTER — Ambulatory Visit (INDEPENDENT_AMBULATORY_CARE_PROVIDER_SITE_OTHER): Payer: BC Managed Care – PPO | Admitting: Family

## 2014-09-20 ENCOUNTER — Encounter: Payer: Self-pay | Admitting: Family

## 2014-09-20 VITALS — BP 129/79 | HR 81 | Temp 97.9°F | Resp 18 | Ht 63.0 in | Wt 166.0 lb

## 2014-09-20 DIAGNOSIS — Z0000 Encounter for general adult medical examination without abnormal findings: Secondary | ICD-10-CM | POA: Insufficient documentation

## 2014-09-20 HISTORY — DX: Encounter for general adult medical examination without abnormal findings: Z00.00

## 2014-09-20 LAB — BASIC METABOLIC PANEL
BUN: 16 mg/dL (ref 6–23)
CO2: 26 mEq/L (ref 19–32)
Calcium: 9.2 mg/dL (ref 8.4–10.5)
Chloride: 102 mEq/L (ref 96–112)
Creatinine, Ser: 0.7 mg/dL (ref 0.4–1.2)
GFR: 103.32 mL/min (ref >60.00)
Glucose, Bld: 84 mg/dL (ref 70–99)
POTASSIUM: 3.4 meq/L — AB (ref 3.5–5.1)
Sodium: 137 mEq/L (ref 135–145)

## 2014-09-20 LAB — CBC WITH DIFFERENTIAL/PLATELET
Basophils Absolute: 0 10*3/uL (ref 0.0–0.1)
Basophils Relative: 0.6 % (ref 0.0–3.0)
EOS PCT: 5.9 % — AB (ref 0.0–5.0)
Eosinophils Absolute: 0.3 10*3/uL (ref 0.0–0.7)
HEMATOCRIT: 35.9 % — AB (ref 36.0–46.0)
Hemoglobin: 11.7 g/dL — ABNORMAL LOW (ref 12.0–15.0)
LYMPHS ABS: 2.2 10*3/uL (ref 0.7–4.0)
Lymphocytes Relative: 43.5 % (ref 12.0–46.0)
MCHC: 32.5 g/dL (ref 30.0–36.0)
MCV: 92.5 fl (ref 78.0–100.0)
Monocytes Absolute: 0.4 10*3/uL (ref 0.1–1.0)
Monocytes Relative: 8.4 % (ref 3.0–12.0)
NEUTROS PCT: 41.6 % — AB (ref 43.0–77.0)
Neutro Abs: 2.1 10*3/uL (ref 1.4–7.7)
Platelets: 219 10*3/uL (ref 150.0–400.0)
RBC: 3.89 Mil/uL (ref 3.87–5.11)
RDW: 13.5 % (ref 11.5–15.5)
WBC: 5.1 10*3/uL (ref 4.0–10.5)

## 2014-09-20 LAB — URINALYSIS, ROUTINE W REFLEX MICROSCOPIC
BILIRUBIN URINE: NEGATIVE
Hgb urine dipstick: NEGATIVE
Ketones, ur: NEGATIVE
Leukocytes, UA: NEGATIVE
Nitrite: NEGATIVE
Specific Gravity, Urine: 1.02 (ref 1.000–1.030)
TOTAL PROTEIN, URINE-UPE24: NEGATIVE
URINE GLUCOSE: NEGATIVE
Urobilinogen, UA: 0.2 (ref 0.0–1.0)
pH: 6.5 (ref 5.0–8.0)

## 2014-09-20 LAB — HEPATIC FUNCTION PANEL
ALK PHOS: 38 U/L — AB (ref 39–117)
ALT: 10 U/L (ref 0–35)
AST: 25 U/L (ref 0–37)
Albumin: 3.5 g/dL (ref 3.5–5.2)
BILIRUBIN DIRECT: 0.1 mg/dL (ref 0.0–0.3)
BILIRUBIN TOTAL: 0.8 mg/dL (ref 0.2–1.2)
Total Protein: 7.5 g/dL (ref 6.0–8.3)

## 2014-09-20 LAB — LIPID PANEL
CHOLESTEROL: 201 mg/dL — AB (ref 0–200)
HDL: 63.6 mg/dL (ref >39.00)
LDL Cholesterol: 123 mg/dL — ABNORMAL HIGH (ref 0–99)
NonHDL: 137.4
Total CHOL/HDL Ratio: 3
Triglycerides: 70 mg/dL (ref 0.0–149.0)
VLDL: 14 mg/dL (ref 0.0–40.0)

## 2014-09-20 NOTE — Progress Notes (Signed)
Pre visit review using our clinic review tool, if applicable. No additional management support is needed unless otherwise documented below in the visit note. 

## 2014-09-20 NOTE — Assessment & Plan Note (Signed)
Refer for bone density.  Refer for colonoscopy.  Declines flu shot. Encouraged exercise.  Pt will complete mammogram next month.

## 2014-09-20 NOTE — Progress Notes (Signed)
   Subjective:    Patient ID: Courtney Keller, female    DOB: 1956/06/23, 58 y.o.   MRN: [id number]  HPI Courtney Keller is a 58 year old female who presents today for a physical.  1. Diet- 2. Exercise- 3. Immunizations - 4. Preventative -    Review of Systems     Objective:   Physical Exam        Assessment & Plan:

## 2014-09-20 NOTE — Patient Instructions (Signed)
Please complete lab work prior to leaving. Please schedule a follow up appointment in 6 months.  You will be contacted re: colonoscopy and bone density.

## 2014-09-20 NOTE — Progress Notes (Signed)
Subjective:    Patient ID: Courtney Keller, female    DOB: 1956-04-16, 58 y.o.   MRN: [id number]  HPI  Patient presents today for complete physical.  Immunizations:  Declines flu shot Diet: reports healthy diet Exercise: some, just started back.  Colonoscopy:due Dexa: due Pap Smear:  Hysterectomy  Mammogram: scheduled next month     Review of Systems  Constitutional: Negative for unexpected weight change.  HENT: Negative for rhinorrhea.   Eyes: Negative for visual disturbance.  Respiratory: Negative for cough and shortness of breath.   Cardiovascular: Negative for chest pain and leg swelling.  Gastrointestinal: Negative for vomiting, diarrhea and constipation.  Genitourinary: Negative for dysuria and frequency.  Musculoskeletal: Negative for myalgias and arthralgias.  Skin: Negative for rash.  Neurological: Negative for headaches.  Hematological: Negative for adenopathy.  Psychiatric/Behavioral:       Denies depression/anxiety   Past Medical History  Diagnosis Date  . Hypertension   . Breast cancer   . Cancer     History   Social History  . Marital Status: Married    Spouse Name: N/A    Number of Children: N/A  . Years of Education: N/A   Occupational History  . Not on file.   Social History Main Topics  . Smoking status: Never Smoker   . Smokeless tobacco: Never Used  . Alcohol Use: 3.0 oz/week    5 Glasses of wine per week  . Drug Use: No  . Sexual Activity: Not on file   Other Topics Concern  . Not on file   Social History Narrative   1 daughter- [name]- born 47.   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 Breast lumpectomy Left 2006  . Abdominal hysterectomy  2006    Family History  Problem Relation Age of Onset  . Hypertension Mother   . Cancer Mother 37    breast cancer  . Hypertension  Maternal Grandmother     No Known Allergies  Current Outpatient Prescriptions on File Prior to Visit  Medication Sig Dispense Refill  . aspirin 81 MG tablet Take 81 mg by mouth daily.    . benazepril-hydrochlorthiazide (LOTENSIN HCT) 10-12.5 MG per tablet TAKE 1 TABLET BY MOUTH DAILY. 30 tablet 5  . calcium carbonate (OS-CAL) 600 MG TABS Take 600 mg by mouth 2 (two) times daily with a meal.      . tamoxifen (NOLVADEX) 20 MG tablet Take 1 tablet (20 mg total) by mouth daily. 90 tablet 3   No current facility-administered medications on file prior to visit.    BP 129/79 mmHg  Pulse 81  Temp(Src) 97.9 F (36.6 C) (Oral)  Resp 18  Ht 5\' 3"  (1.6 m)  Wt 166 lb (75.297 kg)  BMI 29.41 kg/m2  SpO2 100%       Objective:   Physical Exam  Physical Exam  Constitutional: She is oriented to person, place, and time. She appears well-developed and well-nourished. No distress.  HENT:  Head: Normocephalic and atraumatic.  Right Ear: Tympanic membrane and ear canal normal.  Left Ear: Tympanic membrane and ear canal normal.  Mouth/Throat: Oropharynx is clear and moist.  Eyes: Pupils are equal, round, and reactive to light. No scleral icterus.  Neck: Normal range of motion. No thyromegaly present.  Cardiovascular: Normal rate  and regular rhythm.   No murmur heard. Pulmonary/Chest: Effort normal and breath sounds normal. No respiratory distress. He has no wheezes. She has no rales. She exhibits no tenderness.  Abdominal: Soft. Bowel sounds are normal. He exhibits no distension and no mass. There is no tenderness. There is no rebound and no guarding.  Musculoskeletal: She exhibits no edema.  Lymphadenopathy:    She has no cervical adenopathy.  Neurological: She is alert and oriented to person, place, and time. She exhibits normal muscle tone. Coordination normal.  Skin: Skin is warm and dry.  Psychiatric: She has a normal mood and affect. Her behavior is normal. Judgment and thought content  normal.  Breasts: Examined lying Right: Without masses, retractions, discharge or axillary adenopathy.  Left: Without masses, retractions, discharge or axillary adenopathy. Surgical scarring noted left lower breast Inguinal/mons: Normal without inguinal adenopathy       Assessment & Plan:        Assessment & Plan:

## 2014-09-21 LAB — TSH: TSH: 1.16 u[IU]/mL (ref 0.35–4.50)

## 2014-09-22 ENCOUNTER — Telehealth: Payer: Self-pay | Admitting: Family

## 2014-09-22 DIAGNOSIS — E876 Hypokalemia: Secondary | ICD-10-CM

## 2014-09-22 MED ORDER — POTASSIUM CHLORIDE CRYS ER 20 MEQ PO TBCR: 20.0000 meq | EXTENDED_RELEASE_TABLET | Freq: Every day | ORAL | Status: DC

## 2014-09-22 NOTE — Telephone Encounter (Signed)
Potassium is low and she remains mildly anemic. Start kdur once daily, repeat bmet in 1 week, dx hyperkalemia.

## 2014-09-24 NOTE — Telephone Encounter (Signed)
Left message for pt to return my call.

## 2014-09-27 NOTE — Telephone Encounter (Signed)
Patient returned phone call. Best # [phone]

## 2014-09-28 NOTE — Telephone Encounter (Signed)
Notified pt. Lab appt scheduled for 10/05/14 at 3:15. Future order entered. Pt also scheduled for nurse visit EKG that could not be completed at physical on 09/20/14 due to the system upgrade. Ekg has already been ordered in EPIC, just needs to be completed.

## 2014-09-28 NOTE — Telephone Encounter (Signed)
Correction to below documentation. Pt potassium was mildly low, will associate hypokalemia with future lab order instead of hyperkalemia as noted below.

## 2014-09-28 NOTE — Addendum Note (Signed)
Addended by: Kelle Darting A on: 09/28/2014 08:32 AM   Modules accepted: Orders

## 2014-10-05 ENCOUNTER — Other Ambulatory Visit (INDEPENDENT_AMBULATORY_CARE_PROVIDER_SITE_OTHER): Payer: BC Managed Care – PPO

## 2014-10-05 ENCOUNTER — Ambulatory Visit: Payer: BC Managed Care – PPO | Admitting: Family

## 2014-10-05 DIAGNOSIS — E876 Hypokalemia: Secondary | ICD-10-CM

## 2014-10-05 DIAGNOSIS — Z0000 Encounter for general adult medical examination without abnormal findings: Secondary | ICD-10-CM

## 2014-10-05 NOTE — Progress Notes (Signed)
EKG was not able to be completed at 09/20/14 office visit due to system upgrade / difficulties. EKG completed today and pt notified normal per verbal from Provider.

## 2014-10-06 ENCOUNTER — Encounter: Payer: Self-pay | Admitting: Family

## 2014-10-06 LAB — BASIC METABOLIC PANEL
BUN: 12 mg/dL (ref 6–23)
CALCIUM: 9.4 mg/dL (ref 8.4–10.5)
CHLORIDE: 104 meq/L (ref 96–112)
CO2: 26 meq/L (ref 19–32)
Creatinine, Ser: 0.7 mg/dL (ref 0.4–1.2)
GFR: 117.89 mL/min (ref >60.00)
Glucose, Bld: 87 mg/dL (ref 70–99)
Potassium: 3.7 mEq/L (ref 3.5–5.1)
SODIUM: 140 meq/L (ref 135–145)

## 2014-10-12 ENCOUNTER — Ambulatory Visit (INDEPENDENT_AMBULATORY_CARE_PROVIDER_SITE_OTHER)
Admission: RE | Admit: 2014-10-12 | Discharge: 2014-10-12 | Disposition: A | Discharge status: Home / Self Care | Payer: BC Managed Care – PPO | Source: Ambulatory Visit | Attending: Family | Admitting: Family

## 2014-10-12 DIAGNOSIS — Z0000 Encounter for general adult medical examination without abnormal findings: Secondary | ICD-10-CM

## 2014-10-18 ENCOUNTER — Encounter: Payer: Self-pay | Admitting: Family

## 2014-10-19 ENCOUNTER — Other Ambulatory Visit: Payer: Self-pay | Admitting: *Deleted

## 2014-10-19 ENCOUNTER — Other Ambulatory Visit: Payer: PRIVATE HEALTH INSURANCE

## 2014-10-19 DIAGNOSIS — C50919 Malignant neoplasm of unspecified site of unspecified female breast: Secondary | ICD-10-CM

## 2014-10-25 ENCOUNTER — Other Ambulatory Visit (HOSPITAL_BASED_OUTPATIENT_CLINIC_OR_DEPARTMENT_OTHER): Payer: BC Managed Care – PPO

## 2014-10-25 ENCOUNTER — Other Ambulatory Visit: Payer: BC Managed Care – PPO

## 2014-10-25 ENCOUNTER — Telehealth: Payer: Self-pay | Admitting: Oncology

## 2014-10-25 ENCOUNTER — Encounter: Payer: Self-pay | Admitting: Oncology

## 2014-10-25 DIAGNOSIS — C50919 Malignant neoplasm of unspecified site of unspecified female breast: Secondary | ICD-10-CM

## 2014-10-25 DIAGNOSIS — C50319 Malignant neoplasm of lower-inner quadrant of unspecified female breast: Secondary | ICD-10-CM

## 2014-10-25 LAB — CBC WITH DIFFERENTIAL/PLATELET
BASO%: 0.6 % (ref 0.0–2.0)
BASOS ABS: 0 10*3/uL (ref 0.0–0.1)
EOS ABS: 0.3 10*3/uL (ref 0.0–0.5)
EOS%: 4.7 % (ref 0.0–7.0)
HCT: 37.9 % (ref 34.8–46.6)
HEMOGLOBIN: 12.1 g/dL (ref 11.6–15.9)
LYMPH%: 42.7 % (ref 14.0–49.7)
MCH: 29.4 pg (ref 25.1–34.0)
MCHC: 31.9 g/dL (ref 31.5–36.0)
MCV: 92.2 fL (ref 79.5–101.0)
MONO#: 0.5 10*3/uL (ref 0.1–0.9)
MONO%: 7.3 % (ref 0.0–14.0)
NEUT#: 2.9 10*3/uL (ref 1.5–6.5)
NEUT%: 44.7 % (ref 38.4–76.8)
Platelets: 237 10*3/uL (ref 145–400)
RBC: 4.12 10*6/uL (ref 3.70–5.45)
RDW: 13.4 % (ref 11.2–14.5)
WBC: 6.6 10*3/uL (ref 3.9–10.3)
lymph#: 2.8 10*3/uL (ref 0.9–3.3)

## 2014-10-25 LAB — COMPREHENSIVE METABOLIC PANEL (CC13)
ALBUMIN: 3.7 g/dL (ref 3.5–5.0)
ALT: 10 U/L (ref 0–55)
ANION GAP: 13 meq/L — AB (ref 3–11)
AST: 22 U/L (ref 5–34)
Alkaline Phosphatase: 49 U/L (ref 40–150)
BUN: 10.6 mg/dL (ref 7.0–26.0)
CALCIUM: 9.7 mg/dL (ref 8.4–10.4)
CHLORIDE: 102 meq/L (ref 98–109)
CO2: 25 mEq/L (ref 22–29)
Creatinine: 0.8 mg/dL (ref 0.6–1.1)
GLUCOSE: 102 mg/dL (ref 70–140)
Potassium: 3.8 mEq/L (ref 3.5–5.1)
SODIUM: 140 meq/L (ref 136–145)
TOTAL PROTEIN: 7.4 g/dL (ref 6.4–8.3)
Total Bilirubin: 0.31 mg/dL (ref 0.20–1.20)

## 2014-10-25 NOTE — Telephone Encounter (Signed)
pt cld stating missed lab appt-wanted to come today-scg pt @ 4 adv some results may not be ready by appt 12/8-pt understood

## 2014-10-26 ENCOUNTER — Ambulatory Visit (HOSPITAL_BASED_OUTPATIENT_CLINIC_OR_DEPARTMENT_OTHER): Payer: BC Managed Care – PPO | Admitting: Oncology

## 2014-10-26 VITALS — BP 136/78 | HR 92 | Temp 98.6°F | Resp 19 | Ht 63.0 in | Wt 162.4 lb

## 2014-10-26 DIAGNOSIS — C50912 Malignant neoplasm of unspecified site of left female breast: Secondary | ICD-10-CM

## 2014-10-26 DIAGNOSIS — Z853 Personal history of malignant neoplasm of breast: Secondary | ICD-10-CM

## 2014-10-26 NOTE — Progress Notes (Signed)
ID: Courtney Keller   DOB: 06-03-1956  MR#: [id number]  CSN#:[id number]  PCP: Courtney Pear., NP GYN:  SU:  OTHER MD:  CHIEF COMPLAINT:  Left Breast Cancer  CURRENT TREATMENT: Tamoxifen   HISTORY OF PRESENT ILLNESS: From the original intake note:  The patient tells me she had a negative mammogram 03/2003.  More recently she noticed a difference, or a mass, in the lower inner quadrant of the left breast.  She brought this to Dr. Doran Keller attention and he set her up for repeat mammogram at the [facility] 11/11/03.  This showed a solid mass in the lower inner quadrant, which was palpable and by ultrasound was irregular, measuring up to 1.9 cm.  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  With this information, the patient was referred to Dr. Bubba Keller and after appropriate discussion she underwent left lumpectomy with sentinel lymph node biopsy 11/29/03.  Unfortunately, because the three sentinel lymph nodes were positive, she had a full axillary lymph node dissection with a total of five out of 13 lymph  nodes involved by the tumor, which measured 2.5 cm.  Margins were negative.  Grade was 1.  There was no evidence of lymphovascular invasion.   Her subsequent history is as detailed below  INTERVAL HISTORY: Courtney Keller returns today for follow-up of her breast cancer. She completes 10 years of antiestrogen therapy this month. She is tolerating the tamoxifen fine, with no hot flashes to speak of and actually some vaginal dryness is that of vaginal wetness. Cost was never an issue.  REVIEW OF SYSTEMS: Courtney Keller is not exercising "as much as I should" but she does exercise some, chiefly by walking. A detailed review of systems today was otherwise entirely negative.   PAST MEDICAL HISTORY:  (Updated December 2014)  Past Medical History  Diagnosis Date  . Hypertension   . Breast cancer   . Cancer      PAST SURGICAL HISTORY: Past Surgical History  Procedure Laterality Date  . Breast lumpectomy Left 2006  . Abdominal hysterectomy  2006  She is status post myomectomy x 3, and status post TAH/BSO under Dr. Raphael Keller  FAMILY HISTORY  (updated December 2014)  Family History  Problem Relation Age of Onset  . Hypertension Mother   . Cancer Mother 74    breast cancer  . Hypertension Maternal Grandmother    The patient does not know much about her father, but she believes that he has died.  She does not know the cause.  The patient's mother is alive and was diagnosed with breast cancer at age 65 [facility]) .  The patient has one brother in good health.  The only other history of breast cancer is a cousin of the patient's mother who had breast cancer diagnosed in her 34s.  GYNECOLOGIC HISTORY: GX P0, status post remote TAH/BS0   SOCIAL HISTORY:  (updated December 2014) She works part-time in the administration at [employer].  Her husband Courtney Keller is retired. They moved here from [place]. Courtney Keller has a daughter from a prior marriage, Courtney Keller, who lives in [place].  She had a son who was murdered many years ago, and has a granddaughter through him, as well as two other grandchildren, including one which is an adopted child of Courtney Keller's.  Courtney Keller and Courtney Keller have adopted a girl, Courtney Keller.  They are Baptists.    ADVANCED DIRECTIVES:  HEALTH MAINTENANCE:  (updated December 2014) History  Substance Use Topics  . Smoking status: Never Smoker   . Smokeless tobacco: Never Used  . Alcohol Use: 3.0 oz/week    5 Glasses of wine per week     Colonoscopy: Due  PAP:  S/p Hysterectomy  Bone density:  Nov 2015, Normal  Lipid panel:  Not on file   No Known Allergies  Current Outpatient Prescriptions  Medication Sig Dispense Refill  . aspirin 81 MG tablet Take 81 mg by mouth daily.    . benazepril-hydrochlorthiazide (LOTENSIN HCT) 10-12.5 MG per tablet TAKE 1 TABLET BY MOUTH DAILY. 30 tablet 5  .  calcium carbonate (OS-CAL) 600 MG TABS Take 600 mg by mouth 2 (two) times daily with a meal.      . potassium chloride SA (K-DUR,KLOR-CON) 20 MEQ tablet Take 1 tablet (20 mEq total) by mouth daily. 30 tablet 3  . tamoxifen (NOLVADEX) 20 MG tablet Take 1 tablet (20 mg total) by mouth daily. 90 tablet 3   No current facility-administered medications for this visit.    OBJECTIVE: Middle-aged Courtney Keller woman who appears well Filed Vitals:   10/26/14 0902  BP: 136/78  Pulse: 92  Temp: 98.6 F (37 C)  Resp: 19     Body mass index is 28.78 kg/(m^2).    ECOG FS: 0 Filed Weights   10/26/14 0902  Weight: 162 lb 6.4 oz (73.664 kg)   Sclerae unicteric, pupils equal and reactive Oropharynx clear, dentition in good repair No cervical or supraclavicular adenopathy Lungs no rales or rhonchi Heart regular rate and rhythm Abd soft, nontender, positive bowel sounds MSK no focal spinal tenderness, no upper extremity lymphedema Neuro: nonfocal, well oriented, appropriate affect Breasts: The right breast is unremarkable. The left breast is status post lumpectomy and radiation. There is no evidence of local recurrence. The left axilla is benign.   LAB RESULTS: Lab Results  Component Value Date   WBC 6.6 10/25/2014   NEUTROABS 2.9 10/25/2014   HGB 12.1 10/25/2014   HCT 37.9 10/25/2014   MCV 92.2 10/25/2014   PLT 237 10/25/2014      Chemistry      Component Value Date/Time   NA 140 10/25/2014 1602   NA 140 10/05/2014 1531   K 3.8 10/25/2014 1602   K 3.7 10/05/2014 1531   CL 104 10/05/2014 1531   CL 104 10/20/2012 0823   CO2 25 10/25/2014 1602   CO2 26 10/05/2014 1531   BUN 10.6 10/25/2014 1602   BUN 12 10/05/2014 1531   CREATININE 0.8 10/25/2014 1602   CREATININE 0.7 10/05/2014 1531   CREATININE 0.68 04/07/2014 0938      Component Value Date/Time   CALCIUM 9.7 10/25/2014 1602   CALCIUM 9.4 10/05/2014 1531   ALKPHOS 49 10/25/2014 1602   ALKPHOS 38* 09/20/2014 0849   AST  22 10/25/2014 1602   AST 25 09/20/2014 0849   ALT 10 10/25/2014 1602   ALT 10 09/20/2014 0849   BILITOT 0.31 10/25/2014 1602   BILITOT 0.8 09/20/2014 0849       STUDIES: Bone density 10/17/2014  Lowest T score is -0.8 at the femoral neck This is in the normal range  Encourage adequate intake of Ca and D  Use clinical judgement regarding additional therapy        ASSESSMENT: 58 y.o. 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    (3)  She underwent TAH-BSO in 2004.    (4) She started Arimidex in August of 2005 and switched to tamoxifen after a long discussion in October of 2010.  Tamoxifen was continued until December 2015  PLAN:  Tonea has completed 10 years of antiestrogen therapy. We simply have no better therapy than that and she is ready to be released from follow-up here. She understands the data simply stops at 10 years and since we cannot show that there is a benefit to continuing, my general recommendation at this point is to stop.  We do keep her records (for another 10 years so if she has any question or concerns she can call or she can come in for a visit as needed. However we are not making any further routine appointments for her here.  As far as breast cancer follow-up is concerned all she will need is a yearly breast exam by her primary care physician and a yearly mammogram which she usually gets in December at the [facility]. this is being operationalized.   Chauncey Cruel, MD      10/26/2014

## 2014-12-21 ENCOUNTER — Other Ambulatory Visit: Payer: Self-pay

## 2014-12-21 ENCOUNTER — Encounter: Payer: Self-pay | Admitting: Family

## 2014-12-21 DIAGNOSIS — Z1231 Encounter for screening mammogram for malignant neoplasm of breast: Secondary | ICD-10-CM

## 2014-12-22 ENCOUNTER — Encounter: Payer: Self-pay | Admitting: Gastroenterology

## 2014-12-22 NOTE — Telephone Encounter (Signed)
Pap smear has been updated in health maintenance.

## 2014-12-29 ENCOUNTER — Ambulatory Visit
Admission: RE | Admit: 2014-12-29 | Discharge: 2014-12-29 | Disposition: A | Discharge status: Home / Self Care | Payer: BC Managed Care – PPO | Source: Ambulatory Visit

## 2014-12-29 DIAGNOSIS — Z1231 Encounter for screening mammogram for malignant neoplasm of breast: Secondary | ICD-10-CM

## 2015-01-24 ENCOUNTER — Other Ambulatory Visit: Payer: Self-pay | Admitting: Family

## 2015-02-03 ENCOUNTER — Ambulatory Visit (AMBULATORY_SURGERY_CENTER): Payer: Self-pay | Admitting: *Deleted

## 2015-02-03 VITALS — Ht 63.5 in | Wt 163.8 lb

## 2015-02-03 DIAGNOSIS — Z1211 Encounter for screening for malignant neoplasm of colon: Secondary | ICD-10-CM

## 2015-02-03 MED ORDER — NA SULFATE-K SULFATE-MG SULF 17.5-3.13-1.6 GM/177ML PO SOLN: 1.0000 | Freq: Once | ORAL | Status: DC

## 2015-02-03 NOTE — Progress Notes (Signed)
Pt had a colonoscopy 10 + years ago that was normal but she cannot remember where it was done No egg or soy allergy No issues with past sedation No diet pills No home 02 use emmi video to e mail

## 2015-02-17 ENCOUNTER — Ambulatory Visit (AMBULATORY_SURGERY_CENTER): Payer: BC Managed Care – PPO | Admitting: Gastroenterology

## 2015-02-17 ENCOUNTER — Encounter: Payer: Self-pay | Admitting: Gastroenterology

## 2015-02-17 VITALS — BP 117/58 | HR 89 | Temp 96.0°F | Resp 13 | Ht 63.5 in | Wt 163.0 lb

## 2015-02-17 DIAGNOSIS — Z1211 Encounter for screening for malignant neoplasm of colon: Secondary | ICD-10-CM

## 2015-02-17 MED ORDER — SODIUM CHLORIDE 0.9 % IV SOLN: 500.0000 mL | INTRAVENOUS | Status: DC

## 2015-02-17 NOTE — Progress Notes (Signed)
Report to PACU, RN, vss, BBS= Clear.  

## 2015-02-17 NOTE — Op Note (Signed)
[place]  [address]. [place], [address]   COLONOSCOPY PROCEDURE REPORT  PATIENT: Courtney Keller, Courtney Keller  MR#: #[id number] BIRTHDATE: 1956-05-14 , 58  yrs. old GENDER: female ENDOSCOPIST: Inda Castle, MD REFERRED XB:WIOMBTD O'Sullivan, FNP PROCEDURE DATE:  02/17/2015 PROCEDURE:   Colonoscopy, screening First Screening Colonoscopy - Avg.  risk and is 50 yrs.  old or older - No.  Prior Negative Screening - Now for repeat screening. 10 or more years since last screening  History of Adenoma - Now for follow-up colonoscopy & has been > or = to 3 yrs.  N/A ASA CLASS:   Class II INDICATIONS:Colorectal Neoplasm Risk Assessment for this procedure is average risk. MEDICATIONS: Monitored anesthesia care and Propofol 200 mg IV  DESCRIPTION OF PROCEDURE:   After the risks benefits and alternatives of the procedure were thoroughly explained, informed consent was obtained.  The digital rectal exam revealed no abnormalities of the rectum.   The [id number]  endoscope was introduced through the anus and advanced to the ileum. No adverse events experienced.   The quality of the prep was (Suprep was used) excellent.  The instrument was then slowly withdrawn as the colon was fully examined.      COLON FINDINGS: What appear to be a surgical anastomosis was noted at approximately 20 cm from the anus. There was mild diverticulosis noted in the sigmoid colon.   Internal hemorrhoids were found. The examination was otherwise normal.  Retroflexed views revealed no abnormalities. The time to cecum = 4.4 Withdrawal time = 6.0 The scope was withdrawn and the procedure completed. COMPLICATIONS: There were no immediate complications.  ENDOSCOPIC IMPRESSION: 1.   Mild diverticulosis was noted in the sigmoid colon 2.   Internal hemorrhoids 3.   The examination was otherwise normal  RECOMMENDATIONS: Continue current colorectal screening recommendations for "routine risk"  patients with a repeat colonoscopy in 10 years.  eSigned:  Inda Castle, MD 02/17/2015 12:06 PM   cc:

## 2015-02-17 NOTE — Patient Instructions (Signed)
YOU HAD AN ENDOSCOPIC PROCEDURE TODAY AT [facility]:   Refer to the procedure report that was given to you for any specific questions about what was found during the examination.  If the procedure report does not answer your questions, please call your gastroenterologist to clarify.  If you requested that your care partner not be given the details of your procedure findings, then the procedure report has been included in a sealed envelope for you to review at your convenience later.  YOU SHOULD EXPECT: Some feelings of bloating in the abdomen. Passage of more gas than usual.  Walking can help get rid of the air that was put into your GI tract during the procedure and reduce the bloating. If you had a lower endoscopy (such as a colonoscopy or flexible sigmoidoscopy) you may notice spotting of blood in your stool or on the toilet paper. If you underwent a bowel prep for your procedure, you may not have a normal bowel movement for a few days.  Please Note:  You might notice some irritation and congestion in your nose or some drainage.  This is from the oxygen used during your procedure.  There is no need for concern and it should clear up in a day or so.  SYMPTOMS TO REPORT IMMEDIATELY:   Following lower endoscopy (colonoscopy or flexible sigmoidoscopy):  Excessive amounts of blood in the stool  Significant tenderness or worsening of abdominal pains  Swelling of the abdomen that is new, acute  Fever of 100F or higher    For urgent or emergent issues, a gastroenterologist can be reached at any hour by calling [phone].   DIET: Your first meal following the procedure should be a small meal and then it is ok to progress to your normal diet. Heavy or fried foods are harder to digest and may make you feel nauseous or bloated.  Likewise, meals heavy in dairy and vegetables can increase bloating.  Drink plenty of fluids but you should avoid alcoholic beverages for 24  hours.  ACTIVITY:  You should plan to take it easy for the rest of today and you should NOT DRIVE or use heavy machinery until tomorrow (because of the sedation medicines used during the test).    FOLLOW UP: Our staff will call the number listed on your records the next business day following your procedure to check on you and address any questions or concerns that you may have regarding the information given to you following your procedure. If we do not reach you, we will leave a message.  However, if you are feeling well and you are not experiencing any problems, there is no need to return our call.  We will assume that you have returned to your regular daily activities without incident.  If any biopsies were taken you will be contacted by phone or by letter within the next 1-3 weeks.  Please call us at [phone] if you have not heard about the biopsies in 3 weeks.    SIGNATURES/CONFIDENTIALITY: You and/or your care partner have signed paperwork which will be entered into your electronic medical record.  These signatures attest to the fact that that the information above on your After Visit Summary has been reviewed and is understood.  Full responsibility of the confidentiality of this discharge information lies with you and/or your care-partner.   Resume medications. Information given on diverticulosis,hemorrhoids and high fiber diet with discharge instructions.

## 2015-02-18 ENCOUNTER — Telehealth: Payer: Self-pay | Admitting: *Deleted

## 2015-02-18 NOTE — Telephone Encounter (Signed)
  Follow up Call-  Call back number 02/17/2015  Post procedure Call Back phone  # [phone]  Permission to leave phone message Yes     Patient questions:  Do you have a fever, pain , or abdominal swelling? No. Pain Score  0 *  Have you tolerated food without any problems? Yes.    Have you been able to return to your normal activities? Yes.    Do you have any questions about your discharge instructions: Diet   No. Medications  No. Follow up visit  No.  Do you have questions or concerns about your Care? No.  Actions: * If pain score is 4 or above: No action needed, pain <4.

## 2015-03-08 ENCOUNTER — Other Ambulatory Visit: Payer: Self-pay | Admitting: Family

## 2015-03-08 NOTE — Telephone Encounter (Signed)
Refill sent per [facility] refill protocol/SLS  

## 2015-03-21 ENCOUNTER — Ambulatory Visit (INDEPENDENT_AMBULATORY_CARE_PROVIDER_SITE_OTHER): Payer: BC Managed Care – PPO | Admitting: Family

## 2015-03-21 ENCOUNTER — Encounter: Payer: Self-pay | Admitting: Family

## 2015-03-21 VITALS — BP 110/78 | HR 82 | Temp 98.0°F | Resp 16 | Ht 63.0 in | Wt 163.8 lb

## 2015-03-21 DIAGNOSIS — I10 Essential (primary) hypertension: Secondary | ICD-10-CM

## 2015-03-21 LAB — BASIC METABOLIC PANEL
BUN: 14 mg/dL (ref 6–23)
CALCIUM: 10 mg/dL (ref 8.4–10.5)
CHLORIDE: 100 meq/L (ref 96–112)
CO2: 32 mEq/L (ref 19–32)
Creatinine, Ser: 0.77 mg/dL (ref 0.40–1.20)
GFR: 98.52 mL/min (ref >60.00)
Glucose, Bld: 87 mg/dL (ref 70–99)
Potassium: 3.7 mEq/L (ref 3.5–5.1)
Sodium: 138 mEq/L (ref 135–145)

## 2015-03-21 MED ORDER — POTASSIUM CHLORIDE CRYS ER 20 MEQ PO TBCR: EXTENDED_RELEASE_TABLET | ORAL | Status: DC

## 2015-03-21 MED ORDER — BENAZEPRIL-HYDROCHLOROTHIAZIDE 10-12.5 MG PO TABS
1.0000 | ORAL_TABLET | Freq: Every day | ORAL | Status: DC
Start: 2015-03-21 — End: 2015-10-10

## 2015-03-21 NOTE — Assessment & Plan Note (Signed)
BP stable on current meds.  Obtain bmet, continue lotensin hct.

## 2015-03-21 NOTE — Patient Instructions (Signed)
Please follow up in mid November for annual physical. Please complete lab work prior to leaving.

## 2015-03-21 NOTE — Progress Notes (Signed)
Pre visit review using our clinic review tool, if applicable. No additional management support is needed unless otherwise documented below in the visit note. 

## 2015-03-21 NOTE — Progress Notes (Signed)
Subjective:    Patient ID: Courtney Keller, female    DOB: May 28, 1956, 59 y.o.   MRN: [id number]  HPI   [name] is a 59 yr old female who presents today for follow up of her hypertension.  Patient is currently maintained on the following medications for blood pressure: lotensin hct Patient reports good compliance with blood pressure medications. Patient denies chest pain, shortness of breath or swelling. Last 3 blood pressure readings in our office are as follows: BP Readings from Last 3 Encounters:  03/21/15 110/78  02/17/15 117/58  10/26/14 136/78   She did report that she had a mild dizziness intermittently for a few days- attributed to sinus congestion- now resolved.     Review of Systems    see HPI  Past Medical History  Diagnosis Date  . Hypertension   . Breast cancer   . Cancer     History   Social History  . Marital Status: Married    Spouse Name: N/A  . Number of Children: N/A  . Years of Education: N/A   Occupational History  . Not on file.   Social History Main Topics  . Smoking status: Never Smoker   . Smokeless tobacco: Never Used  . Alcohol Use: 3.0 oz/week    5 Glasses of wine per week     Comment: 5 glasses of wine a week   . Drug Use: No  . Sexual Activity: Not on file   Other Topics Concern  . Not on file   Social History Narrative   1 daughter- [name]- born 82.   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 Breast lumpectomy Left 2006  . Abdominal hysterectomy  2006  . Colonoscopy      colon 10 + years ago normal per pt. cannot remember where it was done  . Myomectomy      Family History  Problem Relation Age of Onset  . Hypertension Mother   . Cancer Mother 62    breast cancer  . Breast cancer Mother   . Hypertension Maternal Grandmother   . Colon cancer Neg Hx   . Rectal  cancer Neg Hx   . Stomach cancer Neg Hx     No Known Allergies  Current Outpatient Prescriptions on File Prior to Visit  Medication Sig Dispense Refill  . aspirin 81 MG tablet Take 81 mg by mouth daily.    . calcium carbonate (OS-CAL) 600 MG TABS Take 600 mg by mouth 2 (two) times daily with a meal.       No current facility-administered medications on file prior to visit.    BP 110/78 mmHg  Pulse 82  Temp(Src) 98 F (36.7 C) (Oral)  Resp 16  Ht 5\' 3"  (1.6 m)  Wt 163 lb 12.8 oz (74.299 kg)  BMI 29.02 kg/m2  SpO2 99%    Objective:   Physical Exam  Constitutional: She appears well-developed and well-nourished.  HENT:  Head: Normocephalic and atraumatic.  Right Ear: Tympanic membrane and ear canal normal.  Left Ear: Tympanic membrane and ear canal normal.  Mouth/Throat: No oropharyngeal exudate, posterior oropharyngeal edema or posterior oropharyngeal erythema.  Cardiovascular: Normal rate, regular rhythm and normal heart sounds.   No murmur heard. Pulmonary/Chest: Effort normal and breath sounds normal. No respiratory distress.  She has no wheezes.  Psychiatric: She has a normal mood and affect. Her behavior is normal. Judgment and thought content normal.          Assessment & Plan:

## 2015-05-07 ENCOUNTER — Other Ambulatory Visit: Payer: Self-pay | Admitting: Family

## 2015-10-07 ENCOUNTER — Telehealth: Payer: Self-pay | Admitting: Behavioral Health

## 2015-10-07 ENCOUNTER — Encounter: Payer: Self-pay | Admitting: Behavioral Health

## 2015-10-07 NOTE — Telephone Encounter (Signed)
Pre-Visit Call completed with patient and chart updated.   Pre-Visit Info documented in Specialty Comments under [employer].    

## 2015-10-07 NOTE — Addendum Note (Signed)
Addended by: Eduard Roux E on: 10/07/2015 04:41 PM   Modules accepted: Medications

## 2015-10-07 NOTE — Telephone Encounter (Signed)
Patient voiced that this was an inconvenient time to complete Pre-Visit Info and will call the office back once she's returned home.

## 2015-10-10 ENCOUNTER — Encounter: Payer: Self-pay | Admitting: Family

## 2015-10-10 ENCOUNTER — Ambulatory Visit (INDEPENDENT_AMBULATORY_CARE_PROVIDER_SITE_OTHER): Payer: BC Managed Care – PPO | Admitting: Family

## 2015-10-10 VITALS — BP 121/74 | HR 83 | Temp 98.5°F | Resp 16 | Ht 63.5 in | Wt 160.2 lb

## 2015-10-10 DIAGNOSIS — Z114 Encounter for screening for human immunodeficiency virus [HIV]: Secondary | ICD-10-CM

## 2015-10-10 DIAGNOSIS — Z1159 Encounter for screening for other viral diseases: Secondary | ICD-10-CM

## 2015-10-10 DIAGNOSIS — Z0000 Encounter for general adult medical examination without abnormal findings: Secondary | ICD-10-CM

## 2015-10-10 LAB — HEPATIC FUNCTION PANEL
ALK PHOS: 64 U/L (ref 39–117)
ALT: 13 U/L (ref 0–35)
AST: 24 U/L (ref 0–37)
Albumin: 4.2 g/dL (ref 3.5–5.2)
BILIRUBIN DIRECT: 0.1 mg/dL (ref 0.0–0.3)
BILIRUBIN TOTAL: 0.8 mg/dL (ref 0.2–1.2)
Total Protein: 7.5 g/dL (ref 6.0–8.3)

## 2015-10-10 LAB — URINALYSIS, ROUTINE W REFLEX MICROSCOPIC
BILIRUBIN URINE: NEGATIVE
Hgb urine dipstick: NEGATIVE
Ketones, ur: NEGATIVE
Leukocytes, UA: NEGATIVE
Nitrite: NEGATIVE
PH: 8 (ref 5.0–8.0)
RBC / HPF: NONE SEEN (ref 0–?)
Specific Gravity, Urine: 1.02 (ref 1.000–1.030)
Total Protein, Urine: NEGATIVE
Urine Glucose: NEGATIVE
Urobilinogen, UA: 0.2 (ref 0.0–1.0)

## 2015-10-10 LAB — BASIC METABOLIC PANEL
BUN: 18 mg/dL (ref 6–23)
CHLORIDE: 104 meq/L (ref 96–112)
CO2: 29 mEq/L (ref 19–32)
Calcium: 9.8 mg/dL (ref 8.4–10.5)
Creatinine, Ser: 0.69 mg/dL (ref 0.40–1.20)
GFR: 111.61 mL/min (ref >60.00)
GLUCOSE: 88 mg/dL (ref 70–99)
POTASSIUM: 3.6 meq/L (ref 3.5–5.1)
Sodium: 140 mEq/L (ref 135–145)

## 2015-10-10 LAB — LIPID PANEL
CHOLESTEROL: 225 mg/dL — AB (ref 0–200)
HDL: 67.1 mg/dL (ref >39.00)
LDL CALC: 149 mg/dL — AB (ref 0–99)
NonHDL: 158.28
TRIGLYCERIDES: 45 mg/dL (ref 0.0–149.0)
Total CHOL/HDL Ratio: 3
VLDL: 9 mg/dL (ref 0.0–40.0)

## 2015-10-10 LAB — TSH: TSH: 1.21 u[IU]/mL (ref 0.35–4.50)

## 2015-10-10 LAB — HEPATITIS C ANTIBODY: HCV Ab: NEGATIVE

## 2015-10-10 MED ORDER — BENAZEPRIL-HYDROCHLOROTHIAZIDE 10-12.5 MG PO TABS: 1.0000 | ORAL_TABLET | Freq: Every day | ORAL | Status: DC

## 2015-10-10 MED ORDER — POTASSIUM CHLORIDE CRYS ER 20 MEQ PO TBCR: EXTENDED_RELEASE_TABLET | ORAL | Status: DC

## 2015-10-10 NOTE — Progress Notes (Addendum)
Subjective:    Patient ID: NIKEA WOLTER, female    DOB: 12/30/1955, 59 y.o.   MRN: [id number]  HPI  [name] is  35 yr ol female who presents for cpx.  Immunizations:  Declines flu shot Diet: healthy Exercise: some walking Colonoscopy: up to date Dexa: 11/15 Pap Smear: hysterectomy Mammogram: up to date  Eye/dental: up to date  Wt Readings from Last 3 Encounters:  10/10/15 160 lb 3.2 oz (72.666 kg)  03/21/15 163 lb 12.8 oz (74.299 kg)  02/17/15 163 lb (73.936 kg)    Review of Systems  Constitutional: Negative for unexpected weight change.  HENT: Negative for hearing loss and rhinorrhea.   Eyes: Negative for visual disturbance.  Respiratory: Negative for cough and shortness of breath.   Cardiovascular: Negative for chest pain.  Gastrointestinal: Negative for diarrhea and constipation.  Genitourinary: Negative for dysuria and frequency.  Musculoskeletal: Negative for myalgias and arthralgias.  Skin: Negative for rash.  Neurological: Negative for headaches.  Hematological: Negative for adenopathy.  Psychiatric/Behavioral:       Denies depression/anxiety       Past Medical History  Diagnosis Date  . Hypertension   . Breast cancer ([place])   . Cancer [facility])     Social History   Social History  . Marital Status: Married    Spouse Name: N/A  . Number of Children: N/A  . Years of Education: N/A   Occupational History  . Not on file.   Social History Main Topics  . Smoking status: Never Smoker   . Smokeless tobacco: Never Used  . Alcohol Use: 3.0 oz/week    5 Glasses of wine per week     Comment: 5 glasses of wine a week   . Drug Use: No  . Sexual Activity: Not on file   Other Topics Concern  . Not on file   Social History Narrative   1 daughter- [name]- born 57.   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 Breast lumpectomy Left 2006  . Abdominal hysterectomy  2006  . Colonoscopy      colon 10 + years ago normal per pt. cannot remember where it was done  . Myomectomy      Family History  Problem Relation Age of Onset  . Hypertension Mother   . Cancer Mother 43    breast cancer  . Breast cancer Mother   . Hypertension Maternal Grandmother   . Colon cancer Neg Hx   . Rectal cancer Neg Hx   . Stomach cancer Neg Hx     No Known Allergies  Current Outpatient Prescriptions on File Prior to Visit  Medication Sig Dispense Refill  . aspirin 81 MG tablet Take 81 mg by mouth daily.    . benazepril-hydrochlorthiazide (LOTENSIN HCT) 10-12.5 MG per tablet Take 1 tablet by mouth daily. 90 tablet 1  . calcium carbonate (OS-CAL) 600 MG TABS Take 600 mg by mouth 2 (two) times daily with a meal.      . potassium chloride SA (KLOR-CON M20) 20 MEQ tablet TAKE 1 TABLET (20 MEQ TOTAL) BY MOUTH DAILY. 90 tablet 1   No current facility-administered medications on file prior to visit.    BP 121/74 mmHg  Pulse 83  Temp(Src) 98.5 F (36.9 C) (Oral)  Resp 16  Ht 5' 3.5" (1.613 m)  Wt 160 lb 3.2 oz (72.666 kg)  BMI 27.93 kg/m2  SpO2 100%    Objective:   Physical Exam   Physical Exam  Constitutional: She is oriented to person, place, and time. She appears well-developed and well-nourished. No distress.  HENT:  Head: Normocephalic and atraumatic.  Right Ear: Tympanic membrane and ear canal normal.  Left Ear: Tympanic membrane and ear canal normal.  Mouth/Throat: Oropharynx is clear and moist.  Eyes: Pupils are equal, round, and reactive to light. No scleral icterus.  Neck: Normal range of motion. No thyromegaly present.  Cardiovascular: Normal rate and regular rhythm.   No murmur heard. Pulmonary/Chest: Effort normal and breath sounds normal. No respiratory distress. He has no wheezes. She has no rales. She exhibits no tenderness.  Abdominal: Soft. Bowel sounds are normal. He exhibits no  distension and no mass. There is no tenderness. There is no rebound and no guarding.  Musculoskeletal: She exhibits no edema.  Lymphadenopathy:    She has no cervical adenopathy.  Neurological: She is alert and oriented to person, place, and time. She has normal reflexes. She exhibits normal muscle tone. Coordination normal.  Skin: Skin is warm and dry.  Psychiatric: She has a normal mood and affect. Her behavior is normal. Judgment and thought content normal.  Breasts: Examined lying Right: Without masses, retractions, discharge or axillary adenopathy.  Left: Without masses, retractions, discharge or axillary adenopathy. Pelvic: deferred       Assessment & Plan:        Assessment & Plan:  Preventative- continue healthy diet, exercise. Obtain routine labs. Encouraged pt to check with insurance company re: coverage for shingles vaccine (zostavax) then schedule nurse visit for administration.   EKG tracing is personally reviewed.  EKG notes NSR.  No acute changes.

## 2015-10-10 NOTE — Progress Notes (Signed)
Pre visit review using our clinic review tool, if applicable. No additional management support is needed unless otherwise documented below in the visit note. 

## 2015-10-10 NOTE — Patient Instructions (Signed)
Please check with insurance company re: coverage for shingles vaccine (zostavax) then schedule nurse visit for administration of vaccine. Complete lab work prior to leaving. Continue healthy diet and try to add back in your walking.

## 2015-10-10 NOTE — Addendum Note (Signed)
Addended by: Debbrah Alar on: 10/10/2015 11:54 AM   Modules accepted: Miquel Dunn

## 2015-10-11 LAB — CBC WITH DIFFERENTIAL/PLATELET
BASOS ABS: 0 10*3/uL (ref 0.0–0.1)
BASOS PCT: 0.4 % (ref 0.0–3.0)
EOS ABS: 0.2 10*3/uL (ref 0.0–0.7)
EOS PCT: 5.9 % — AB (ref 0.0–5.0)
HEMATOCRIT: 36.7 % (ref 36.0–46.0)
HEMOGLOBIN: 12.1 g/dL (ref 12.0–15.0)
Lymphocytes Relative: 41.8 % (ref 12.0–46.0)
Lymphs Abs: 1.7 10*3/uL (ref 0.7–4.0)
MCHC: 33 g/dL (ref 30.0–36.0)
MCV: 91.3 fl (ref 78.0–100.0)
Monocytes Absolute: 0.3 10*3/uL (ref 0.1–1.0)
Monocytes Relative: 8.7 % (ref 3.0–12.0)
Neutro Abs: 1.7 10*3/uL (ref 1.4–7.7)
Neutrophils Relative %: 43.2 % (ref 43.0–77.0)
Platelets: 222 10*3/uL (ref 150.0–400.0)
RBC: 4.02 Mil/uL (ref 3.87–5.11)
RDW: 13.8 % (ref 11.5–15.5)
WBC: 4 10*3/uL (ref 4.0–10.5)

## 2015-10-11 LAB — HIV ANTIBODY (ROUTINE TESTING W REFLEX): HIV 1&2 Ab, 4th Generation: NONREACTIVE

## 2015-10-14 ENCOUNTER — Encounter: Payer: Self-pay | Admitting: Family

## 2015-10-14 DIAGNOSIS — E785 Hyperlipidemia, unspecified: Secondary | ICD-10-CM

## 2015-12-03 ENCOUNTER — Other Ambulatory Visit: Payer: Self-pay | Admitting: Family

## 2016-04-03 ENCOUNTER — Other Ambulatory Visit: Payer: Self-pay

## 2016-04-03 DIAGNOSIS — Z1231 Encounter for screening mammogram for malignant neoplasm of breast: Secondary | ICD-10-CM

## 2016-04-12 ENCOUNTER — Ambulatory Visit: Payer: BC Managed Care – PPO

## 2016-04-24 ENCOUNTER — Ambulatory Visit
Admission: RE | Admit: 2016-04-24 | Discharge: 2016-04-24 | Disposition: A | Discharge status: Home / Self Care | Payer: BC Managed Care – PPO | Source: Ambulatory Visit

## 2016-04-24 DIAGNOSIS — Z1231 Encounter for screening mammogram for malignant neoplasm of breast: Secondary | ICD-10-CM

## 2016-04-30 ENCOUNTER — Other Ambulatory Visit: Payer: Self-pay | Admitting: Family

## 2016-08-22 ENCOUNTER — Telehealth: Payer: Self-pay | Admitting: Family

## 2016-08-22 NOTE — Telephone Encounter (Signed)
Last ov 10/10/15. Last fill 10/10/15 #90 refill 1. Please advise.

## 2016-08-23 NOTE — Telephone Encounter (Signed)
30 tabs sent. Needs ov prior to additional refills.

## 2016-08-28 NOTE — Telephone Encounter (Signed)
Pt has been scheduled.  °

## 2016-08-31 ENCOUNTER — Ambulatory Visit: Payer: BC Managed Care – PPO | Admitting: Family

## 2016-09-05 ENCOUNTER — Encounter: Payer: Self-pay | Admitting: Family

## 2016-09-05 ENCOUNTER — Ambulatory Visit (INDEPENDENT_AMBULATORY_CARE_PROVIDER_SITE_OTHER): Payer: BC Managed Care – PPO | Admitting: Family

## 2016-09-05 VITALS — BP 127/72 | HR 87 | Temp 98.4°F | Resp 16 | Ht 64.0 in | Wt 153.8 lb

## 2016-09-05 DIAGNOSIS — I10 Essential (primary) hypertension: Secondary | ICD-10-CM

## 2016-09-05 LAB — BASIC METABOLIC PANEL
BUN: 15 mg/dL (ref 6–23)
CALCIUM: 9.8 mg/dL (ref 8.4–10.5)
CO2: 31 meq/L (ref 19–32)
Chloride: 101 mEq/L (ref 96–112)
Creatinine, Ser: 0.75 mg/dL (ref 0.40–1.20)
GFR: 101.06 mL/min (ref >60.00)
Glucose, Bld: 93 mg/dL (ref 70–99)
Potassium: 3.6 mEq/L (ref 3.5–5.1)
SODIUM: 140 meq/L (ref 135–145)

## 2016-09-05 MED ORDER — BENAZEPRIL-HYDROCHLOROTHIAZIDE 10-12.5 MG PO TABS: 1.0000 | ORAL_TABLET | Freq: Every day | ORAL | 1 refills | Status: DC

## 2016-09-05 NOTE — Progress Notes (Signed)
Pre visit review using our clinic review tool, if applicable. No additional management support is needed unless otherwise documented below in the visit note. 

## 2016-09-05 NOTE — Progress Notes (Signed)
Subjective:    Patient ID: Courtney Keller, female    DOB: 1956/02/04, 60 y.o.   MRN: [id number]  HPI  [name] is a 60 yr old female who presents today for follow up of her hypertension.   She is maintained on lotensin HCT. Denies any side effects from the medications. Denies CP/SOB or swelling.   BP Readings from Last 3 Encounters:  09/05/16 127/72  10/10/15 121/74  03/21/15 110/78      Review of Systems See HPI  Past Medical History:  Diagnosis Date  . Breast cancer ([place])   . Cancer ([place])   . Hypertension      Social History   Social History  . Marital status: Married    Spouse name: N/A  . Number of children: N/A  . Years of education: N/A   Occupational History  . Not on file.   Social History Main Topics  . Smoking status: Never Smoker  . Smokeless tobacco: Never Used  . Alcohol use 3.0 oz/week    5 Glasses of wine per week     Comment: 5 glasses of wine a week   . Drug use: No  . Sexual activity: Not on file   Other Topics Concern  . Not on file   Social History Narrative   1 daughter- [name]- born 28.   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 ABDOMINAL HYSTERECTOMY  2006  . BREAST LUMPECTOMY Left 2006  . COLONOSCOPY     colon 10 + years ago normal per pt. cannot remember where it was done  . MYOMECTOMY      Family History  Problem Relation Age of Onset  . Hypertension Mother   . Cancer Mother 56    breast cancer  . Breast cancer Mother   . Hypertension Maternal Grandmother   . Colon cancer Neg Hx   . Rectal cancer Neg Hx   . Stomach cancer Neg Hx     No Known Allergies  Current Outpatient Prescriptions on File Prior to Visit  Medication Sig Dispense Refill  . aspirin 81 MG tablet Take 81 mg by mouth daily.    . benazepril-hydrochlorthiazide (LOTENSIN HCT) 10-12.5 MG tablet TAKE  1 TABLET BY MOUTH DAILY. 30 tablet 0  . calcium carbonate (OS-CAL) 600 MG TABS Take 600 mg by mouth 2 (two) times daily with a meal.      . KLOR-CON M20 20 MEQ tablet TAKE 1 TABLET BY MOUTH EVERY DAY 90 tablet 1   No current facility-administered medications on file prior to visit.     BP 127/72 (BP Location: Right Arm, Patient Position: Sitting, Cuff Size: Normal)   Pulse 87   Temp 98.4 F (36.9 C) (Oral)   Resp 16   Ht 5\' 4"  (1.626 m)   Wt 153 lb 12.8 oz (69.8 kg)   SpO2 100% Comment: ROOM AIR  BMI 26.40 kg/m       Objective:   Physical Exam  Constitutional: She appears well-developed and well-nourished.  HENT:  Head: Normocephalic and atraumatic.  Cardiovascular: Normal rate, regular rhythm and normal heart sounds.   No murmur heard. Pulmonary/Chest: Effort normal and breath sounds normal. No respiratory distress. She has no wheezes.  Musculoskeletal: She exhibits no edema.  Psychiatric: She has a normal mood and  affect. Her behavior is normal. Judgment and thought content normal.          Assessment & Plan:  Declines flu shot.

## 2016-09-05 NOTE — Assessment & Plan Note (Signed)
BP is stable on current medications, continue same. Obtain follow up bmet.

## 2016-09-05 NOTE — Patient Instructions (Signed)
Please schedule a complete physical in December. Complete lab work prior to leaving. Continue current medications.

## 2016-10-16 ENCOUNTER — Encounter: Payer: BC Managed Care – PPO | Admitting: Family

## 2016-11-07 ENCOUNTER — Ambulatory Visit (INDEPENDENT_AMBULATORY_CARE_PROVIDER_SITE_OTHER): Payer: BC Managed Care – PPO | Admitting: Family

## 2016-11-07 ENCOUNTER — Encounter: Payer: Self-pay | Admitting: Family

## 2016-11-07 VITALS — BP 134/73 | HR 89 | Temp 98.2°F | Resp 16 | Ht 64.0 in | Wt 155.2 lb

## 2016-11-07 DIAGNOSIS — Z0000 Encounter for general adult medical examination without abnormal findings: Secondary | ICD-10-CM | POA: Diagnosis not present

## 2016-11-07 LAB — BASIC METABOLIC PANEL
BUN: 17 mg/dL (ref 6–23)
CALCIUM: 9.7 mg/dL (ref 8.4–10.5)
CO2: 29 meq/L (ref 19–32)
Chloride: 106 mEq/L (ref 96–112)
Creatinine, Ser: 0.72 mg/dL (ref 0.40–1.20)
GFR: 105.88 mL/min (ref >60.00)
Glucose, Bld: 90 mg/dL (ref 70–99)
Potassium: 3.9 mEq/L (ref 3.5–5.1)
Sodium: 143 mEq/L (ref 135–145)

## 2016-11-07 LAB — URINALYSIS, ROUTINE W REFLEX MICROSCOPIC
Bilirubin Urine: NEGATIVE
HGB URINE DIPSTICK: NEGATIVE
Ketones, ur: NEGATIVE
LEUKOCYTES UA: NEGATIVE
NITRITE: NEGATIVE
Specific Gravity, Urine: 1.015 (ref 1.000–1.030)
Total Protein, Urine: NEGATIVE
Urine Glucose: NEGATIVE
Urobilinogen, UA: 1 (ref 0.0–1.0)
pH: 8 (ref 5.0–8.0)

## 2016-11-07 LAB — CBC WITH DIFFERENTIAL/PLATELET
BASOS ABS: 0 10*3/uL (ref 0.0–0.1)
BASOS PCT: 0.6 % (ref 0.0–3.0)
EOS ABS: 0.2 10*3/uL (ref 0.0–0.7)
Eosinophils Relative: 4.6 % (ref 0.0–5.0)
HCT: 37.2 % (ref 36.0–46.0)
Hemoglobin: 12.3 g/dL (ref 12.0–15.0)
LYMPHS ABS: 1.7 10*3/uL (ref 0.7–4.0)
Lymphocytes Relative: 39.8 % (ref 12.0–46.0)
MCHC: 33.2 g/dL (ref 30.0–36.0)
MCV: 91.6 fl (ref 78.0–100.0)
Monocytes Absolute: 0.4 10*3/uL (ref 0.1–1.0)
Monocytes Relative: 8.7 % (ref 3.0–12.0)
NEUTROS PCT: 46.3 % (ref 43.0–77.0)
Neutro Abs: 2 10*3/uL (ref 1.4–7.7)
Platelets: 210 10*3/uL (ref 150.0–400.0)
RBC: 4.06 Mil/uL (ref 3.87–5.11)
RDW: 13.5 % (ref 11.5–15.5)
WBC: 4.3 10*3/uL (ref 4.0–10.5)

## 2016-11-07 LAB — HEPATIC FUNCTION PANEL
ALBUMIN: 4.4 g/dL (ref 3.5–5.2)
ALK PHOS: 56 U/L (ref 39–117)
ALT: 13 U/L (ref 0–35)
AST: 26 U/L (ref 0–37)
BILIRUBIN DIRECT: 0.2 mg/dL (ref 0.0–0.3)
Total Bilirubin: 0.7 mg/dL (ref 0.2–1.2)
Total Protein: 7.7 g/dL (ref 6.0–8.3)

## 2016-11-07 LAB — LIPID PANEL
Cholesterol: 236 mg/dL — ABNORMAL HIGH (ref 0–200)
HDL: 74.7 mg/dL (ref >39.00)
LDL Cholesterol: 151 mg/dL — ABNORMAL HIGH (ref 0–99)
NONHDL: 161.45
Total CHOL/HDL Ratio: 3
Triglycerides: 54 mg/dL (ref 0.0–149.0)
VLDL: 10.8 mg/dL (ref 0.0–40.0)

## 2016-11-07 LAB — TSH: TSH: 1.01 u[IU]/mL (ref 0.35–4.50)

## 2016-11-07 NOTE — Progress Notes (Signed)
Pre visit review using our clinic review tool, if applicable. No additional management support is needed unless otherwise documented below in the visit note. 

## 2016-11-07 NOTE — Progress Notes (Addendum)
Subjective:    Patient ID: Courtney Keller, female    DOB: February 10, 1956, 60 y.o.   MRN: [id number]  HPI   Patient presents today for complete physical.  Immunizations: tetanus is up to date, declines flu shot Diet: healthy Exercise: walk Colonoscopy: 02/17/15 Dexa:  11/11/14 Pap Smear: hysterectomy Mammogram:  04/24/16    Review of Systems  Constitutional: Negative for unexpected weight change.  HENT: Negative for hearing loss and rhinorrhea.   Eyes: Negative for visual disturbance.  Respiratory: Negative for cough and shortness of breath.   Cardiovascular: Negative for chest pain.  Gastrointestinal: Negative for blood in stool, constipation and diarrhea.  Genitourinary: Negative for dysuria, frequency and hematuria.  Musculoskeletal: Negative for arthralgias and myalgias.  Neurological: Negative for headaches.  Hematological: Negative for adenopathy.  Psychiatric/Behavioral:       Denies depression/anxiety   Past Medical History:  Diagnosis Date  . Breast cancer ([place])   . Cancer ([place])   . Hypertension      Social History   Social History  . Marital status: Married    Spouse name: N/A  . Number of children: N/A  . Years of education: N/A   Occupational History  . Not on file.   Social History Main Topics  . Smoking status: Never Smoker  . Smokeless tobacco: Never Used  . Alcohol use 3.0 oz/week    5 Glasses of wine per week     Comment: 5 glasses of wine a week   . Drug use: No  . Sexual activity: Not on file   Other Topics Concern  . Not on file   Social History Narrative   1 daughter- [name]- born 64.   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 ABDOMINAL HYSTERECTOMY  2006  . BREAST LUMPECTOMY Left 2006  . COLONOSCOPY     colon 10 + years ago normal per pt. cannot remember where it was done  .  MYOMECTOMY      Family History  Problem Relation Age of Onset  . Hypertension Mother   . Cancer Mother 59    breast cancer  . Breast cancer Mother   . Hypertension Maternal Grandmother   . Colon cancer Neg Hx   . Rectal cancer Neg Hx   . Stomach cancer Neg Hx     No Known Allergies  Current Outpatient Prescriptions on File Prior to Visit  Medication Sig Dispense Refill  . aspirin 81 MG tablet Take 81 mg by mouth daily.    . benazepril-hydrochlorthiazide (LOTENSIN HCT) 10-12.5 MG tablet Take 1 tablet by mouth daily. 90 tablet 1  . calcium carbonate (OS-CAL) 600 MG TABS Take 600 mg by mouth 2 (two) times daily with a meal.      . KLOR-CON M20 20 MEQ tablet TAKE 1 TABLET BY MOUTH EVERY DAY 90 tablet 1   No current facility-administered medications on file prior to visit.     BP 134/73 (BP Location: Right Arm, Cuff Size: Normal)   Pulse 89   Temp 98.2 F (36.8 C) (Oral)   Resp 16   Ht 5\' 4"  (1.626 m)   Wt 155 lb 3.2 oz (70.4 kg)   SpO2 100%   BMI 26.64 kg/m       Objective:   Physical Exam  Physical Exam  Constitutional: She is oriented to person, place, and time. She appears well-developed and well-nourished. No distress.  HENT:  Head: Normocephalic and atraumatic.  Right Ear: Tympanic membrane and ear canal normal.  Left Ear: Tympanic membrane and ear canal normal.  Mouth/Throat: Oropharynx is clear and moist.  Eyes: Pupils are equal, round, and reactive to light. No scleral icterus.  Neck: Normal range of motion. No thyromegaly present.  Cardiovascular: Normal rate and regular rhythm.   No murmur heard. Pulmonary/Chest: Effort normal and breath sounds normal. No respiratory distress. He has no wheezes. She has no rales. She exhibits no tenderness.  Abdominal: Soft. Bowel sounds are normal. She exhibits no distension and no mass. There is no tenderness. There is no rebound and no guarding.  Musculoskeletal: She exhibits no edema.  Lymphadenopathy:    She has no  cervical adenopathy.  Neurological: She is alert and oriented to person, place, and time. She has normal patellar reflexes. She exhibits normal muscle tone. Coordination normal.  Skin: Skin is warm and dry.  Psychiatric: She has a normal mood and affect. Her behavior is normal. Judgment and thought content normal.  Breasts: Examined lying, bilateral inverted nipples Right: Without masses, ,no discharge or axillary adenopathy.  Left: Without masses, no discharge or axillary adenopathy.         Assessment & Plan:        Assessment & Plan:  Preventative Care- immunizations reviewed and up to date. Obtain routine lab work. mammo and colo and dexa up to date.  Continue healthy diet and exercise. EKG tracing is personally reviewed.  EKG notes NSR.  No acute changes.

## 2016-11-07 NOTE — Patient Instructions (Signed)
Please continue healthy diet and exercise. Complete lab work prior to leaving.

## 2016-11-08 ENCOUNTER — Encounter: Payer: Self-pay | Admitting: Family

## 2016-11-18 ENCOUNTER — Other Ambulatory Visit: Payer: Self-pay | Admitting: Family

## 2016-11-20 NOTE — Telephone Encounter (Signed)
Refill sent per [facility] refill protocol/SLS  

## 2016-11-22 ENCOUNTER — Encounter: Payer: Self-pay | Admitting: Family

## 2017-04-22 ENCOUNTER — Other Ambulatory Visit: Payer: Self-pay | Admitting: Family

## 2017-04-27 ENCOUNTER — Other Ambulatory Visit: Payer: Self-pay | Admitting: Family

## 2017-05-02 ENCOUNTER — Other Ambulatory Visit: Payer: Self-pay | Admitting: Family

## 2017-05-08 ENCOUNTER — Ambulatory Visit (INDEPENDENT_AMBULATORY_CARE_PROVIDER_SITE_OTHER): Payer: BC Managed Care – PPO | Admitting: Family

## 2017-05-08 ENCOUNTER — Encounter: Payer: Self-pay | Admitting: Family

## 2017-05-08 VITALS — BP 133/85 | HR 79 | Temp 98.3°F | Resp 16 | Ht 64.0 in | Wt 157.6 lb

## 2017-05-08 DIAGNOSIS — I10 Essential (primary) hypertension: Secondary | ICD-10-CM

## 2017-05-08 LAB — BASIC METABOLIC PANEL
BUN: 16 mg/dL (ref 6–23)
CO2: 29 meq/L (ref 19–32)
Calcium: 9.7 mg/dL (ref 8.4–10.5)
Chloride: 101 mEq/L (ref 96–112)
Creatinine, Ser: 0.73 mg/dL (ref 0.40–1.20)
GFR: 104.03 mL/min (ref >60.00)
GLUCOSE: 90 mg/dL (ref 70–99)
POTASSIUM: 3.7 meq/L (ref 3.5–5.1)
SODIUM: 138 meq/L (ref 135–145)

## 2017-05-08 MED ORDER — POTASSIUM CHLORIDE ER 10 MEQ PO TBCR: EXTENDED_RELEASE_TABLET | ORAL | 5 refills | Status: DC

## 2017-05-08 NOTE — Assessment & Plan Note (Signed)
BP is stable on current meds. Continue same. I will break the Kdur into two 10 meq tabs once daily.

## 2017-05-08 NOTE — Patient Instructions (Signed)
Please complete lab work prior to leaving. Have a great summer!

## 2017-05-08 NOTE — Progress Notes (Signed)
Subjective:    Patient ID: Courtney Keller, female    DOB: [birth year], 61 y.o.   MRN: [id number]  HPI  Courtney Keller is a 61 yr old female who presents today for follow up.  She was out of meds and restarted 2 days ago.   HTN- Current BP medications include lotensin HCT.  She is also maintained on a potassium supplement- though she admits she does not always take it because the pill is so large.   BP Readings from Last 3 Encounters:  05/08/17 133/85  11/07/16 134/73  09/05/16 127/72    Review of Systems See HPI  Past Medical History:  Diagnosis Date  . Breast cancer ([place])   . Cancer ([place])   . Hypertension      Social History   Social History  . Marital status: Married    Spouse name: N/A  . Number of children: N/A  . Years of education: N/A   Occupational History  . Not on file.   Social History Main Topics  . Smoking status: Never Smoker  . Smokeless tobacco: Never Used  . Alcohol use 3.0 oz/week    5 Glasses of wine per week     Comment: 5 glasses of wine a week   . Drug use: No  . Sexual activity: Not on file   Other Topics Concern  . Not on file   Social History Narrative   1 daughter- [name]- born 68.   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 ABDOMINAL HYSTERECTOMY  2006  . BREAST LUMPECTOMY Left 2006  . COLONOSCOPY     colon 10 + years ago normal per pt. cannot remember where it was done  . MYOMECTOMY      Family History  Problem Relation Age of Onset  . Hypertension Mother   . Cancer Mother 17       breast cancer  . Breast cancer Mother   . Hypertension Maternal Grandmother   . Colon cancer Neg Hx   . Rectal cancer Neg Hx   . Stomach cancer Neg Hx     No Known Allergies  Current Outpatient Prescriptions on File Prior to Visit  Medication Sig Dispense Refill  . aspirin 81  MG tablet Take 81 mg by mouth daily.    . benazepril-hydrochlorthiazide (LOTENSIN HCT) 10-12.5 MG tablet TAKE 1 TABLET BY MOUTH DAILY. 90 tablet 1  . calcium carbonate (OS-CAL) 600 MG TABS Take 600 mg by mouth 2 (two) times daily with a meal.      . KLOR-CON M20 20 MEQ tablet TAKE 1 TABLET BY MOUTH EVERY DAY 90 tablet 1   No current facility-administered medications on file prior to visit.     BP 133/85 (BP Location: Right Arm, Cuff Size: Normal)   Pulse 79   Temp 98.3 F (36.8 C) (Oral)   Resp 16   Ht 5\' 4"  (1.626 m)   Wt 157 lb 9.6 oz (71.5 kg)   SpO2 100%   BMI 27.05 kg/m       Objective:   Physical Exam  Constitutional: She appears well-developed and well-nourished.  Cardiovascular: Normal rate, regular rhythm and normal heart sounds.   No murmur heard. Pulmonary/Chest: Effort normal and breath sounds normal. No respiratory distress. She has no wheezes.  Psychiatric: She  has a normal mood and affect. Her behavior is normal. Judgment and thought content normal.          Assessment & Plan:

## 2017-05-21 ENCOUNTER — Other Ambulatory Visit: Payer: Self-pay | Admitting: Family

## 2017-05-21 DIAGNOSIS — Z1231 Encounter for screening mammogram for malignant neoplasm of breast: Secondary | ICD-10-CM

## 2017-05-29 ENCOUNTER — Ambulatory Visit
Admission: RE | Admit: 2017-05-29 | Discharge: 2017-05-29 | Disposition: A | Discharge status: Home / Self Care | Payer: BC Managed Care – PPO | Source: Ambulatory Visit | Attending: Family | Admitting: Family

## 2017-05-29 DIAGNOSIS — Z1231 Encounter for screening mammogram for malignant neoplasm of breast: Secondary | ICD-10-CM

## 2017-08-14 ENCOUNTER — Encounter (HOSPITAL_BASED_OUTPATIENT_CLINIC_OR_DEPARTMENT_OTHER): Payer: Self-pay

## 2017-08-14 ENCOUNTER — Emergency Department (HOSPITAL_BASED_OUTPATIENT_CLINIC_OR_DEPARTMENT_OTHER)
Admission: EM | Admit: 2017-08-14 | Discharge: 2017-08-14 | Disposition: A | Discharge status: Home / Self Care | Payer: BC Managed Care – PPO | Attending: Emergency Medicine | Admitting: Emergency Medicine

## 2017-08-14 DIAGNOSIS — Z79899 Other long term (current) drug therapy: Secondary | ICD-10-CM | POA: Diagnosis not present

## 2017-08-14 DIAGNOSIS — R0789 Other chest pain: Secondary | ICD-10-CM | POA: Insufficient documentation

## 2017-08-14 DIAGNOSIS — Y9389 Activity, other specified: Secondary | ICD-10-CM | POA: Diagnosis not present

## 2017-08-14 DIAGNOSIS — I10 Essential (primary) hypertension: Secondary | ICD-10-CM | POA: Diagnosis not present

## 2017-08-14 DIAGNOSIS — S59911A Unspecified injury of right forearm, initial encounter: Secondary | ICD-10-CM | POA: Diagnosis present

## 2017-08-14 DIAGNOSIS — Y929 Unspecified place or not applicable: Secondary | ICD-10-CM | POA: Insufficient documentation

## 2017-08-14 DIAGNOSIS — S5011XA Contusion of right forearm, initial encounter: Secondary | ICD-10-CM | POA: Insufficient documentation

## 2017-08-14 DIAGNOSIS — Y999 Unspecified external cause status: Secondary | ICD-10-CM | POA: Insufficient documentation

## 2017-08-14 DIAGNOSIS — M25561 Pain in right knee: Secondary | ICD-10-CM | POA: Insufficient documentation

## 2017-08-14 MED ORDER — METHOCARBAMOL 500 MG PO TABS: 500.0000 mg | ORAL_TABLET | Freq: Every evening | ORAL | 0 refills | Status: DC | PRN

## 2017-08-14 NOTE — Discharge Instructions (Addendum)
Take Ibuprofen or Tylenol for pain. Take this medicine with food. Take muscle relaxer as needed Use a heating pad for sore muscles - use for 20 minutes several times a day Return for worsening symptoms
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
## 2017-08-14 NOTE — ED Notes (Signed)
Pt verbalizes understanding of d/c instructions and denies any further needs at this time. 
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
## 2017-10-31 ENCOUNTER — Other Ambulatory Visit: Payer: Self-pay | Admitting: Family

## 2017-11-08 ENCOUNTER — Ambulatory Visit (INDEPENDENT_AMBULATORY_CARE_PROVIDER_SITE_OTHER): Payer: BC Managed Care – PPO | Admitting: Family

## 2017-11-08 ENCOUNTER — Encounter: Payer: Self-pay | Admitting: Family

## 2017-11-08 VITALS — BP 132/82 | HR 77 | Temp 97.8°F | Resp 16 | Ht 64.0 in | Wt 157.0 lb

## 2017-11-08 DIAGNOSIS — E348 Other specified endocrine disorders: Secondary | ICD-10-CM

## 2017-11-08 DIAGNOSIS — Z0000 Encounter for general adult medical examination without abnormal findings: Secondary | ICD-10-CM

## 2017-11-08 LAB — URINALYSIS, ROUTINE W REFLEX MICROSCOPIC
BILIRUBIN URINE: NEGATIVE
HGB URINE DIPSTICK: NEGATIVE
KETONES UR: NEGATIVE
Nitrite: NEGATIVE
PH: 7 (ref 5.0–8.0)
Specific Gravity, Urine: 1.01 (ref 1.000–1.030)
Total Protein, Urine: NEGATIVE
Urine Glucose: NEGATIVE
Urobilinogen, UA: 0.2 (ref 0.0–1.0)

## 2017-11-08 LAB — BASIC METABOLIC PANEL
BUN: 15 mg/dL (ref 6–23)
CALCIUM: 9.9 mg/dL (ref 8.4–10.5)
CO2: 31 meq/L (ref 19–32)
Chloride: 100 mEq/L (ref 96–112)
Creatinine, Ser: 0.75 mg/dL (ref 0.40–1.20)
GFR: 100.67 mL/min (ref >60.00)
GLUCOSE: 87 mg/dL (ref 70–99)
POTASSIUM: 4.2 meq/L (ref 3.5–5.1)
SODIUM: 139 meq/L (ref 135–145)

## 2017-11-08 LAB — CBC WITH DIFFERENTIAL/PLATELET
Basophils Absolute: 0 10*3/uL (ref 0.0–0.1)
Basophils Relative: 1 % (ref 0.0–3.0)
EOS PCT: 5.5 % — AB (ref 0.0–5.0)
Eosinophils Absolute: 0.2 10*3/uL (ref 0.0–0.7)
HCT: 38.7 % (ref 36.0–46.0)
Hemoglobin: 12.9 g/dL (ref 12.0–15.0)
LYMPHS ABS: 2 10*3/uL (ref 0.7–4.0)
Lymphocytes Relative: 45.9 % (ref 12.0–46.0)
MCHC: 33.3 g/dL (ref 30.0–36.0)
MCV: 93.8 fl (ref 78.0–100.0)
MONO ABS: 0.4 10*3/uL (ref 0.1–1.0)
MONOS PCT: 9.1 % (ref 3.0–12.0)
NEUTROS ABS: 1.7 10*3/uL (ref 1.4–7.7)
NEUTROS PCT: 38.5 % — AB (ref 43.0–77.0)
PLATELETS: 237 10*3/uL (ref 150.0–400.0)
RBC: 4.13 Mil/uL (ref 3.87–5.11)
RDW: 13.1 % (ref 11.5–15.5)
WBC: 4.4 10*3/uL (ref 4.0–10.5)

## 2017-11-08 LAB — LIPID PANEL
CHOLESTEROL: 225 mg/dL — AB (ref 0–200)
HDL: 72.6 mg/dL (ref >39.00)
LDL CALC: 141 mg/dL — AB (ref 0–99)
NonHDL: 152.43
TRIGLYCERIDES: 57 mg/dL (ref 0.0–149.0)
Total CHOL/HDL Ratio: 3
VLDL: 11.4 mg/dL (ref 0.0–40.0)

## 2017-11-08 LAB — HEPATIC FUNCTION PANEL
ALBUMIN: 4.6 g/dL (ref 3.5–5.2)
ALT: 10 U/L (ref 0–35)
AST: 23 U/L (ref 0–37)
Alkaline Phosphatase: 60 U/L (ref 39–117)
BILIRUBIN TOTAL: 0.8 mg/dL (ref 0.2–1.2)
Bilirubin, Direct: 0.1 mg/dL (ref 0.0–0.3)
Total Protein: 8.3 g/dL (ref 6.0–8.3)

## 2017-11-08 LAB — TSH: TSH: 1.1 u[IU]/mL (ref 0.35–4.50)

## 2017-11-08 MED ORDER — ZOSTER VAC RECOMB ADJUVANTED 50 MCG/0.5ML IM SUSR: INTRAMUSCULAR | 1 refills | Status: DC

## 2017-11-08 NOTE — Progress Notes (Signed)
Subjective:    Patient ID: Courtney Keller, female    DOB: [birth year], 61 y.o.   MRN: [id number]  HPI  Patient presents today for complete physical.  Immunizations: tdap up to date, declines flu shot.   Diet: healthy for the most part  Exercise: was walking, plans to restart Colonoscopy: 02/17/15 Dexa: 10/17/14 Pap Smear: hysterectomy Mammogram: 05/29/17 Vision: 1 year ago Dental: due- will schedule  Review of Systems  Constitutional: Negative for unexpected weight change.  HENT: Negative for hearing loss and rhinorrhea.   Eyes: Negative for visual disturbance.  Respiratory: Negative for shortness of breath.   Cardiovascular: Negative for chest pain.  Gastrointestinal: Negative for blood in stool and constipation.  Genitourinary: Negative for dysuria, frequency and hematuria.  Musculoskeletal: Negative for arthralgias and myalgias.  Skin: Negative for rash.  Neurological: Negative for headaches.  Hematological: Negative for adenopathy.  Psychiatric/Behavioral:       Denies depression/anxiety   Past Medical History:  Diagnosis Date  . Breast cancer ([place])   . Cancer ([place])   . Hypertension      Social History   Socioeconomic History  . Marital status: Married    Spouse name: Not on file  . Number of children: Not on file  . Years of education: Not on file  . Highest education level: Not on file  Social Needs  . Financial resource strain: Not on file  . Food insecurity - worry: Not on file  . Food insecurity - inability: Not on file  . Transportation needs - medical: Not on file  . Transportation needs - non-medical: Not on file  Occupational History  . Not on file  Tobacco Use  . Smoking status: Never Smoker  . Smokeless tobacco: Never Used  Substance and Sexual Activity  . Alcohol use: Yes    Alcohol/week: 3.0 oz    Types: 5 Glasses of wine per week    Comment: 5 glasses of wine a week   . Drug use: No  . Sexual activity: Not on file  Other  Topics Concern  . Not on file  Social History Narrative   1 daughter- Courtney Keller- born 43.   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 ABDOMINAL HYSTERECTOMY  2006  . BREAST LUMPECTOMY Left 2006  . COLONOSCOPY     colon 10 + years ago normal per pt. cannot remember where it was done  . MYOMECTOMY      Family History  Problem Relation Age of Onset  . Hypertension Mother   . Cancer Mother 34       breast cancer  . Breast cancer Mother   . Hypertension Maternal Grandmother   . Colon cancer Neg Hx   . Rectal cancer Neg Hx   . Stomach cancer Neg Hx     No Known Allergies  Current Outpatient Medications on File Prior to Visit  Medication Sig Dispense Refill  . aspirin 81 MG tablet Take 81 mg by mouth daily.    . benazepril-hydrochlorthiazide (LOTENSIN HCT) 10-12.5 MG tablet TAKE 1 TABLET BY MOUTH EVERY DAY 90 tablet 0  . calcium carbonate (OS-CAL) 600 MG TABS Take 600 mg by mouth 2 (two) times daily with a meal.      . methocarbamol (ROBAXIN) 500 MG tablet Take 1 tablet (500 mg  total) by mouth at bedtime and may repeat dose one time if needed. 10 tablet 0  . potassium chloride (KLOR-CON 10) 10 MEQ tablet Take 2 tabs by mouth once daily 60 tablet 5   No current facility-administered medications on file prior to visit.     BP 132/82 (BP Location: Right Arm, Patient Position: Sitting, Cuff Size: Small)   Pulse 77   Temp 97.8 F (36.6 C) (Oral)   Resp 16   Ht 5\' 4"  (1.626 m)   Wt 157 lb (71.2 kg)   SpO2 100%   BMI 26.95 kg/m       Objective:   Physical Exam Physical Exam  Constitutional: She is oriented to person, place, and time. She appears well-developed and well-nourished. No distress.  HENT:  Head: Normocephalic and atraumatic.  Right Ear: Tympanic membrane and ear canal normal.  Left Ear: Tympanic membrane and ear canal  normal.  Mouth/Throat: Oropharynx is clear and moist.  Eyes: Pupils are equal, round, and reactive to light. No scleral icterus.  Neck: Normal range of motion. No thyromegaly present.  Cardiovascular: Normal rate and regular rhythm.   No murmur heard. Pulmonary/Chest: Effort normal and breath sounds normal. No respiratory distress. He has no wheezes. She has no rales. She exhibits no tenderness.  Abdominal: Soft. Bowel sounds are normal. She exhibits no distension and no mass. There is no tenderness. There is no rebound and no guarding.  Musculoskeletal: She exhibits no edema.  Lymphadenopathy:    She has no cervical adenopathy.  Neurological: She is alert and oriented to person, place, and time. She has normal patellar reflexes. She exhibits normal muscle tone. Coordination normal.  Skin: Skin is warm and dry.  Psychiatric: She has a normal mood and affect. Her behavior is normal. Judgment and thought content normal.  Breasts: Examined lying bilateral inverted nipples Right: Without masses, discharge or axillary adenopathy.  Left: Without masses, discharge or axillary adenopathy. + scar at 6 o,clock.  Pelvic: deferred            Assessment & Plan:          Assessment & Plan:  EKG tracing is personally reviewed.  EKG notes NSR.  No acute changes.   Preventative care- refer for dexa, declines flu, sent rx to pharmacy for shingrix. Reinforced importance of healthy diet, exercise.

## 2017-11-08 NOTE — Patient Instructions (Addendum)
Please compete lab work prior to leaving. Continue to work on healthy diet and regular exercise.  You can get your Shingles vaccine from your pharmacy if they have it in stock.

## 2017-11-09 ENCOUNTER — Encounter: Payer: Self-pay | Admitting: Family

## 2017-11-15 ENCOUNTER — Encounter: Payer: BC Managed Care – PPO | Admitting: Family

## 2017-12-20 ENCOUNTER — Other Ambulatory Visit: Payer: Self-pay | Admitting: Family

## 2018-02-05 ENCOUNTER — Other Ambulatory Visit: Payer: Self-pay | Admitting: Family

## 2018-03-25 ENCOUNTER — Other Ambulatory Visit: Payer: Self-pay | Admitting: Family

## 2018-04-20 ENCOUNTER — Other Ambulatory Visit: Payer: Self-pay | Admitting: Family

## 2018-04-20 DIAGNOSIS — Z0000 Encounter for general adult medical examination without abnormal findings: Secondary | ICD-10-CM

## 2018-04-21 NOTE — Telephone Encounter (Signed)
Noted and agree. 

## 2018-04-21 NOTE — Telephone Encounter (Signed)
Last K+=4.2 on 11/08/17. Next OV with Lenna Sciara, NP on 05/12/18, will need recheck.

## 2018-05-04 ENCOUNTER — Other Ambulatory Visit: Payer: Self-pay | Admitting: Family

## 2018-05-09 ENCOUNTER — Ambulatory Visit: Payer: BC Managed Care – PPO | Admitting: Family

## 2018-05-12 ENCOUNTER — Ambulatory Visit: Payer: BC Managed Care – PPO | Admitting: Family

## 2018-05-12 ENCOUNTER — Encounter: Payer: Self-pay | Admitting: Family

## 2018-05-12 VITALS — BP 142/85 | HR 83 | Temp 98.4°F | Resp 16 | Ht 64.0 in | Wt 150.0 lb

## 2018-05-12 DIAGNOSIS — Z0000 Encounter for general adult medical examination without abnormal findings: Secondary | ICD-10-CM

## 2018-05-12 DIAGNOSIS — I10 Essential (primary) hypertension: Secondary | ICD-10-CM | POA: Diagnosis not present

## 2018-05-12 LAB — BASIC METABOLIC PANEL
BUN: 12 mg/dL (ref 6–23)
CALCIUM: 9.6 mg/dL (ref 8.4–10.5)
CO2: 29 mEq/L (ref 19–32)
Chloride: 104 mEq/L (ref 96–112)
Creatinine, Ser: 0.68 mg/dL (ref 0.40–1.20)
GFR: 112.54 mL/min (ref >60.00)
GLUCOSE: 89 mg/dL (ref 70–99)
Potassium: 3.9 mEq/L (ref 3.5–5.1)
SODIUM: 141 meq/L (ref 135–145)

## 2018-05-12 MED ORDER — BENAZEPRIL-HYDROCHLOROTHIAZIDE 10-12.5 MG PO TABS: 1.0000 | ORAL_TABLET | Freq: Every day | ORAL | 1 refills | Status: DC

## 2018-05-12 MED ORDER — POTASSIUM CHLORIDE CRYS ER 10 MEQ PO TBCR: EXTENDED_RELEASE_TABLET | ORAL | 1 refills | Status: DC

## 2018-05-12 NOTE — Patient Instructions (Signed)
Please complete lab work prior to leaving.   

## 2018-05-12 NOTE — Progress Notes (Signed)
Subjective:    Patient ID: Courtney Keller, female    DOB: 10/24/1956, 62 y.o.   MRN: [id number]  HPI  Patient is a 62 yr old female who presents today for follow up.  HTN- She is currently maintained on lotensin HCT 10-12.5.  Denies SOB/CP or swelling.   BP Readings from Last 3 Encounters:  05/12/18 (!) 142/85  11/08/17 132/82  08/14/17 (!) 158/100   Review of Systems    see HPI  Past Medical History:  Diagnosis Date  . Breast cancer ([place])   . Cancer ([place])   . Hypertension      Social History   Socioeconomic History  . Marital status: Married    Spouse name: Not on file  . Number of children: Not on file  . Years of education: Not on file  . Highest education level: Not on file  Occupational History  . Not on file  Social Needs  . Financial resource strain: Not on file  . Food insecurity:    Worry: Not on file    Inability: Not on file  . Transportation needs:    Medical: Not on file    Non-medical: Not on file  Tobacco Use  . Smoking status: Never Smoker  . Smokeless tobacco: Never Used  Substance and Sexual Activity  . Alcohol use: Yes    Alcohol/week: 3.0 oz    Types: 5 Glasses of wine per week    Comment: 5 glasses of wine a week   . Drug use: No  . Sexual activity: Not on file  Lifestyle  . Physical activity:    Days per week: Not on file    Minutes per session: Not on file  . Stress: Not on file  Relationships  . Social connections:    Talks on phone: Not on file    Gets together: Not on file    Attends religious service: Not on file    Active member of club or organization: Not on file    Attends meetings of clubs or organizations: Not on file    Relationship status: Not on file  . Intimate partner violence:    Fear of current or ex partner: Not on file    Emotionally abused: Not on file    Physically abused: Not on file    Forced sexual activity: Not on file  Other Topics Concern  . Not on file  Social History Narrative   1 daughter- [name]- born 26.   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 ABDOMINAL HYSTERECTOMY  2006  . BREAST LUMPECTOMY Left 2006  . COLONOSCOPY     colon 10 + years ago normal per pt. cannot remember where it was done  . MYOMECTOMY      Family History  Problem Relation Age of Onset  . Hypertension Mother   . Cancer Mother 35       breast cancer  . Breast cancer Mother   . Hypertension Maternal Grandmother   . Colon cancer Neg Hx   . Rectal cancer Neg Hx   . Stomach cancer Neg Hx     No Known Allergies  Current Outpatient Medications on File Prior to Visit  Medication Sig Dispense Refill  . aspirin 81 MG tablet Take 81 mg by mouth daily.    Marland Kitchen  calcium carbonate (OS-CAL) 600 MG TABS Take 600 mg by mouth 2 (two) times daily with a meal.      . methocarbamol (ROBAXIN) 500 MG tablet Take 1 tablet (500 mg total) by mouth at bedtime and may repeat dose one time if needed. 10 tablet 0   No current facility-administered medications on file prior to visit.     BP (!) 142/85   Pulse 83   Temp 98.4 F (36.9 C) (Oral)   Resp 16   Ht 5\' 4"  (1.626 m)   Wt 150 lb (68 kg)   SpO2 100%   BMI 25.75 kg/m    Objective:   Physical Exam  Constitutional: She appears well-developed and well-nourished.  Cardiovascular: Normal rate, regular rhythm and normal heart sounds.  No murmur heard. Pulmonary/Chest: Effort normal and breath sounds normal. No respiratory distress. She has no wheezes.  Psychiatric: She has a normal mood and affect. Her behavior is normal. Judgment and thought content normal.          Assessment & Plan:  HTN- BP very mildly elevated today.  Will obtain continue current meds/dosing and plan repeat bp in 3 months. Obtain follow up bmet to assess K+ and renal function.

## 2018-07-03 ENCOUNTER — Other Ambulatory Visit: Payer: Self-pay | Admitting: Family

## 2018-07-03 DIAGNOSIS — Z1231 Encounter for screening mammogram for malignant neoplasm of breast: Secondary | ICD-10-CM

## 2018-08-13 ENCOUNTER — Encounter: Payer: Self-pay | Admitting: Family

## 2018-08-13 ENCOUNTER — Ambulatory Visit: Payer: BC Managed Care – PPO | Admitting: Family

## 2018-08-13 VITALS — BP 127/85 | HR 80 | Temp 98.2°F | Resp 16 | Ht 64.0 in | Wt 152.2 lb

## 2018-08-13 DIAGNOSIS — I10 Essential (primary) hypertension: Secondary | ICD-10-CM

## 2018-08-13 NOTE — Progress Notes (Signed)
Subjective:    Patient ID: Courtney Keller, female    DOB: 1956-04-25, 62 y.o.   MRN: [id number]  HPI  Patient is a 62 yr old female who presents today for follow up.  HTN- current meds include lotensin hct.  She also takes kdur. BP Readings from Last 3 Encounters:  08/13/18 127/85  05/12/18 (!) 142/85  11/08/17 132/82     Review of Systems See HPI  Past Medical History:  Diagnosis Date  . Breast cancer ([place])   . Cancer ([place])   . Hypertension      Social History   Socioeconomic History  . Marital status: Married    Spouse name: Not on file  . Number of children: Not on file  . Years of education: Not on file  . Highest education level: Not on file  Occupational History  . Not on file  Social Needs  . Financial resource strain: Not on file  . Food insecurity:    Worry: Not on file    Inability: Not on file  . Transportation needs:    Medical: Not on file    Non-medical: Not on file  Tobacco Use  . Smoking status: Never Smoker  . Smokeless tobacco: Never Used  Substance and Sexual Activity  . Alcohol use: Yes    Alcohol/week: 5.0 standard drinks    Types: 5 Glasses of wine per week    Comment: 5 glasses of wine a week   . Drug use: No  . Sexual activity: Not on file  Lifestyle  . Physical activity:    Days per week: Not on file    Minutes per session: Not on file  . Stress: Not on file  Relationships  . Social connections:    Talks on phone: Not on file    Gets together: Not on file    Attends religious service: Not on file    Active member of club or organization: Not on file    Attends meetings of clubs or organizations: Not on file    Relationship status: Not on file  . Intimate partner violence:    Fear of current or ex partner: Not on file    Emotionally abused: Not on file    Physically abused: Not on file    Forced sexual activity: Not on file  Other Topics Concern  . Not on file  Social History Narrative   1 daughter-  Courtney Keller- born 71.   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 ABDOMINAL HYSTERECTOMY  2006  . BREAST LUMPECTOMY Left 2006  . COLONOSCOPY     colon 10 + years ago normal per pt. cannot remember where it was done  . MYOMECTOMY      Family History  Problem Relation Age of Onset  . Hypertension Mother   . Cancer Mother 38       breast cancer  . Breast cancer Mother   . Hypertension Maternal Grandmother   . Colon cancer Neg Hx   . Rectal cancer Neg Hx   . Stomach cancer Neg Hx     No Known Allergies  Current Outpatient Medications on File Prior to Visit  Medication Sig Dispense Refill  . aspirin 81 MG tablet Take 81 mg by mouth daily.    . benazepril-hydrochlorthiazide (LOTENSIN  HCT) 10-12.5 MG tablet Take 1 tablet by mouth daily. 90 tablet 1  . calcium carbonate (OS-CAL) 600 MG TABS Take 600 mg by mouth 2 (two) times daily with a meal.      . potassium chloride (KLOR-CON M10) 10 MEQ tablet TAKE 2 TABLETS BY MOUTH EVERY DAY.NEED OFFICE VISIT 180 tablet 1   No current facility-administered medications on file prior to visit.     BP 127/85 (BP Location: Right Arm, Patient Position: Sitting, Cuff Size: Small)   Pulse 80   Temp 98.2 F (36.8 C) (Oral)   Resp 16   Ht 5\' 4"  (1.626 m)   Wt 152 lb 3.2 oz (69 kg)   SpO2 100%   BMI 26.13 kg/m       Objective:   Physical Exam  Constitutional: She appears well-developed and well-nourished.  Cardiovascular: Normal rate, regular rhythm and normal heart sounds.  No murmur heard. Pulmonary/Chest: Effort normal and breath sounds normal. No respiratory distress. She has no wheezes.  Psychiatric: She has a normal mood and affect. Her behavior is normal. Judgment and thought content normal.          Assessment & Plan:  HTN- bp stable/improved. Continue current meds.  Declines flu  shot.

## 2018-08-20 ENCOUNTER — Ambulatory Visit
Admission: RE | Admit: 2018-08-20 | Discharge: 2018-08-20 | Disposition: A | Discharge status: Home / Self Care | Payer: BC Managed Care – PPO | Source: Ambulatory Visit | Attending: Family | Admitting: Family

## 2018-08-20 ENCOUNTER — Other Ambulatory Visit: Payer: Self-pay | Admitting: Family

## 2018-08-20 DIAGNOSIS — E348 Other specified endocrine disorders: Secondary | ICD-10-CM

## 2018-08-20 DIAGNOSIS — Z1231 Encounter for screening mammogram for malignant neoplasm of breast: Secondary | ICD-10-CM

## 2018-08-20 DIAGNOSIS — R928 Other abnormal and inconclusive findings on diagnostic imaging of breast: Secondary | ICD-10-CM

## 2018-08-21 ENCOUNTER — Encounter: Payer: Self-pay | Admitting: Family

## 2018-08-21 DIAGNOSIS — M8580 Other specified disorders of bone density and structure, unspecified site: Secondary | ICD-10-CM | POA: Insufficient documentation

## 2018-08-22 ENCOUNTER — Telehealth: Payer: Self-pay | Admitting: *Deleted

## 2018-08-22 NOTE — Telephone Encounter (Signed)
Received Physician Orders from [facility]; forwarded to provider/SLS 10/04

## 2018-08-25 ENCOUNTER — Other Ambulatory Visit: Payer: Self-pay | Admitting: Family

## 2018-08-25 ENCOUNTER — Ambulatory Visit
Admission: RE | Admit: 2018-08-25 | Discharge: 2018-08-25 | Disposition: A | Discharge status: Home / Self Care | Payer: BC Managed Care – PPO | Source: Ambulatory Visit | Attending: Family | Admitting: Family

## 2018-08-25 DIAGNOSIS — N6320 Unspecified lump in the left breast, unspecified quadrant: Secondary | ICD-10-CM

## 2018-08-25 DIAGNOSIS — R928 Other abnormal and inconclusive findings on diagnostic imaging of breast: Secondary | ICD-10-CM

## 2018-08-28 ENCOUNTER — Other Ambulatory Visit: Payer: Self-pay | Admitting: Family

## 2018-08-28 DIAGNOSIS — N6320 Unspecified lump in the left breast, unspecified quadrant: Secondary | ICD-10-CM

## 2018-08-29 ENCOUNTER — Ambulatory Visit
Admission: RE | Admit: 2018-08-29 | Discharge: 2018-08-29 | Disposition: A | Discharge status: Home / Self Care | Payer: BC Managed Care – PPO | Source: Ambulatory Visit | Attending: Family | Admitting: Family

## 2018-08-29 DIAGNOSIS — N6320 Unspecified lump in the left breast, unspecified quadrant: Secondary | ICD-10-CM

## 2018-11-03 ENCOUNTER — Encounter: Payer: Self-pay | Admitting: Family

## 2018-11-03 ENCOUNTER — Ambulatory Visit (INDEPENDENT_AMBULATORY_CARE_PROVIDER_SITE_OTHER): Payer: BC Managed Care – PPO | Admitting: Family

## 2018-11-03 VITALS — BP 158/98 | HR 59 | Temp 98.2°F | Resp 16 | Ht 64.0 in | Wt 156.0 lb

## 2018-11-03 DIAGNOSIS — Z23 Encounter for immunization: Secondary | ICD-10-CM | POA: Diagnosis not present

## 2018-11-03 DIAGNOSIS — I10 Essential (primary) hypertension: Secondary | ICD-10-CM

## 2018-11-03 DIAGNOSIS — Z0000 Encounter for general adult medical examination without abnormal findings: Secondary | ICD-10-CM | POA: Diagnosis not present

## 2018-11-03 LAB — URINALYSIS, ROUTINE W REFLEX MICROSCOPIC
Bilirubin Urine: NEGATIVE
HGB URINE DIPSTICK: NEGATIVE
Ketones, ur: NEGATIVE
Leukocytes, UA: NEGATIVE
NITRITE: NEGATIVE
SPECIFIC GRAVITY, URINE: 1.015 (ref 1.000–1.030)
TOTAL PROTEIN, URINE-UPE24: NEGATIVE
URINE GLUCOSE: NEGATIVE
Urobilinogen, UA: 0.2 (ref 0.0–1.0)
pH: 7.5 (ref 5.0–8.0)

## 2018-11-03 LAB — HEPATIC FUNCTION PANEL
ALK PHOS: 53 U/L (ref 39–117)
ALT: 13 U/L (ref 0–35)
AST: 25 U/L (ref 0–37)
Albumin: 4.4 g/dL (ref 3.5–5.2)
BILIRUBIN DIRECT: 0.1 mg/dL (ref 0.0–0.3)
BILIRUBIN TOTAL: 0.6 mg/dL (ref 0.2–1.2)
Total Protein: 7.3 g/dL (ref 6.0–8.3)

## 2018-11-03 LAB — BASIC METABOLIC PANEL
BUN: 16 mg/dL (ref 6–23)
CALCIUM: 9.6 mg/dL (ref 8.4–10.5)
CO2: 30 mEq/L (ref 19–32)
Chloride: 104 mEq/L (ref 96–112)
Creatinine, Ser: 0.7 mg/dL (ref 0.40–1.20)
GFR: 108.66 mL/min (ref >60.00)
GLUCOSE: 90 mg/dL (ref 70–99)
POTASSIUM: 4.6 meq/L (ref 3.5–5.1)
Sodium: 140 mEq/L (ref 135–145)

## 2018-11-03 LAB — CBC WITH DIFFERENTIAL/PLATELET
BASOS ABS: 0 10*3/uL (ref 0.0–0.1)
Basophils Relative: 0.8 % (ref 0.0–3.0)
EOS ABS: 0.3 10*3/uL (ref 0.0–0.7)
EOS PCT: 6.3 % — AB (ref 0.0–5.0)
HCT: 38.3 % (ref 36.0–46.0)
HEMOGLOBIN: 12.6 g/dL (ref 12.0–15.0)
LYMPHS ABS: 1.9 10*3/uL (ref 0.7–4.0)
Lymphocytes Relative: 45.5 % (ref 12.0–46.0)
MCHC: 32.8 g/dL (ref 30.0–36.0)
MCV: 94.3 fl (ref 78.0–100.0)
MONO ABS: 0.3 10*3/uL (ref 0.1–1.0)
Monocytes Relative: 7.4 % (ref 3.0–12.0)
Neutro Abs: 1.7 10*3/uL (ref 1.4–7.7)
Neutrophils Relative %: 40 % — ABNORMAL LOW (ref 43.0–77.0)
Platelets: 219 10*3/uL (ref 150.0–400.0)
RBC: 4.07 Mil/uL (ref 3.87–5.11)
RDW: 13.7 % (ref 11.5–15.5)
WBC: 4.2 10*3/uL (ref 4.0–10.5)

## 2018-11-03 LAB — LIPID PANEL
CHOL/HDL RATIO: 3
CHOLESTEROL: 217 mg/dL — AB (ref 0–200)
HDL: 71.3 mg/dL (ref >39.00)
LDL CALC: 134 mg/dL — AB (ref 0–99)
NonHDL: 145.72
Triglycerides: 61 mg/dL (ref 0.0–149.0)
VLDL: 12.2 mg/dL (ref 0.0–40.0)

## 2018-11-03 LAB — TSH: TSH: 1.96 u[IU]/mL (ref 0.35–4.50)

## 2018-11-03 MED ORDER — BENAZEPRIL-HYDROCHLOROTHIAZIDE 20-12.5 MG PO TABS: 1.0000 | ORAL_TABLET | Freq: Every day | ORAL | 5 refills | Status: DC

## 2018-11-03 NOTE — Progress Notes (Signed)
Subjective:    Patient ID: Courtney Keller, female    DOB: 1956-03-21, 62 y.o.   MRN: [id number]  HPI  Patient is a 62 year old female who presents today for a routine physical.  Patient presents today for complete physical.  Immunizations: flu shot due.  Tetanus up to date, declines Diet: fair Wt Readings from Last 3 Encounters:  11/03/18 156 lb (70.8 kg)  08/13/18 152 lb 3.2 oz (69 kg)  05/12/18 150 lb (68 kg)  Exercise:  Not recently Colonoscopy:  02/17/15 Dexa: 08/20/18 Pap Smear: hysterectomy Mammogram: 08/20/18 Vision- 1.5 yrs ago Dental:  2.5 yrs ago  HTN- maintained on lotensin HCT- missed a few doses last week.  Last dose was yesterday evening.  BP Readings from Last 3 Encounters:  11/03/18 (!) 158/98  08/13/18 127/85  05/12/18 (!) 142/85    Review of Systems  Constitutional: Negative for unexpected weight change.  HENT: Negative for hearing loss and rhinorrhea.   Eyes: Negative for visual disturbance.  Respiratory: Negative for cough and shortness of breath.   Cardiovascular: Negative for chest pain and leg swelling.  Gastrointestinal: Negative for constipation and diarrhea.  Genitourinary: Negative for dysuria, frequency and hematuria.  Musculoskeletal: Negative for arthralgias and myalgias.  Skin: Negative for rash.  Neurological: Negative for headaches.  Hematological: Negative for adenopathy.  Psychiatric/Behavioral:       Denies depression/anxiety   Past Medical History:  Diagnosis Date  . Breast cancer ([place])   . Cancer ([place])   . Hypertension      Social History   Socioeconomic History  . Marital status: Married    Spouse name: Not on file  . Number of children: Not on file  . Years of education: Not on file  . Highest education level: Not on file  Occupational History  . Not on file  Social Needs  . Financial resource strain: Not on file  . Food insecurity:    Worry: Not on file    Inability: Not on file  . Transportation  needs:    Medical: Not on file    Non-medical: Not on file  Tobacco Use  . Smoking status: Never Smoker  . Smokeless tobacco: Never Used  Substance and Sexual Activity  . Alcohol use: Yes    Alcohol/week: 5.0 standard drinks    Types: 5 Glasses of wine per week    Comment: 5 glasses of wine a week   . Drug use: No  . Sexual activity: Not on file  Lifestyle  . Physical activity:    Days per week: Not on file    Minutes per session: Not on file  . Stress: Not on file  Relationships  . Social connections:    Talks on phone: Not on file    Gets together: Not on file    Attends religious service: Not on file    Active member of club or organization: Not on file    Attends meetings of clubs or organizations: Not on file    Relationship status: Not on file  . Intimate partner violence:    Fear of current or ex partner: Not on file    Emotionally abused: Not on file    Physically abused: Not on file    Forced sexual activity: Not on file  Other Topics Concern  . Not on file  Social History Narrative   1 daughter- [name]- born 80.   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 ABDOMINAL HYSTERECTOMY  2006  . BREAST LUMPECTOMY Left 2006  . COLONOSCOPY     colon 10 + years ago normal per pt. cannot remember where it was done  . MYOMECTOMY      Family History  Problem Relation Age of Onset  . Hypertension Mother   . Cancer Mother 30       breast cancer  . Breast cancer Mother   . Hypertension Maternal Grandmother   . Colon cancer Neg Hx   . Rectal cancer Neg Hx   . Stomach cancer Neg Hx     No Known Allergies  Current Outpatient Medications on File Prior to Visit  Medication Sig Dispense Refill  . benazepril-hydrochlorthiazide (LOTENSIN HCT) 10-12.5 MG tablet Take 1 tablet by mouth daily. 90 tablet 1  . calcium carbonate  (OS-CAL) 600 MG TABS Take 600 mg by mouth 2 (two) times daily with a meal.      . potassium chloride (KLOR-CON M10) 10 MEQ tablet TAKE 2 TABLETS BY MOUTH EVERY DAY.NEED OFFICE VISIT 180 tablet 1   No current facility-administered medications on file prior to visit.     BP (!) 158/98 (BP Location: Right Arm, Patient Position: Sitting, Cuff Size: Small)   Pulse (!) 59   Temp 98.2 F (36.8 C) (Oral)   Resp 16   Ht 5\' 4"  (1.626 m)   Wt 156 lb (70.8 kg)   SpO2 93%   BMI 26.78 kg/m       Objective:   Physical Exam  Physical Exam  Constitutional: She is oriented to person, place, and time. She appears well-developed and well-nourished. No distress.  HENT:  Head: Normocephalic and atraumatic.  Right Ear: Tympanic membrane and ear canal normal.  Left Ear: Tympanic membrane and ear canal normal.  Mouth/Throat: Oropharynx is clear and moist.  Eyes: Pupils are equal, round, and reactive to light. No scleral icterus.  Neck: Normal range of motion. No thyromegaly present.  Cardiovascular: Normal rate and regular rhythm.   No murmur heard. Pulmonary/Chest: Effort normal and breath sounds normal. No respiratory distress. He has no wheezes. She has no rales. She exhibits no tenderness.  Abdominal: Soft. Bowel sounds are normal. She exhibits no distension and no mass. There is no tenderness. There is no rebound and no guarding.  Musculoskeletal: She exhibits no edema.  Lymphadenopathy:    She has no cervical adenopathy.  Neurological: She is alert and oriented to person, place, and time. She has normal patellar reflexes. She exhibits normal muscle tone. Coordination normal.  Skin: Skin is warm and dry.  Psychiatric: She has a normal mood and affect. Her behavior is normal. Judgment and thought content normal.  Breasts: Examined lying Right: Without masses, retractions, discharge or axillary adenopathy.  Left: Without masses,discharge or axillary adenopathy. Surgical scar noted at 6 oclock      Assessment & Plan:   Preventative care- discussed healthy diet, exercise, routine dental care. Flu shot and Shingrix #1 today. Colo, dexa up to date. EKG tracing is personally reviewed.  EKG notes NSR.  No acute changes. Will obtain routine lab work.  HTN- uncontrolled.  Change benazepril from 10-12.5 to 20-12.5 mg.  Follow up in 2 weeks for bp follow up.       Assessment & Plan:

## 2018-11-03 NOTE — Patient Instructions (Addendum)
Please schedule a routine dental exam.  Please complete lab work prior to leaving. Continue to work on [employer], and exercise.  Change benazepril hct from 10-12.5 mg to 20-12.5mg .

## 2018-11-18 ENCOUNTER — Other Ambulatory Visit (INDEPENDENT_AMBULATORY_CARE_PROVIDER_SITE_OTHER): Payer: BC Managed Care – PPO

## 2018-11-18 ENCOUNTER — Ambulatory Visit (INDEPENDENT_AMBULATORY_CARE_PROVIDER_SITE_OTHER): Payer: BC Managed Care – PPO | Admitting: Family

## 2018-11-18 VITALS — BP 126/85 | HR 96

## 2018-11-18 DIAGNOSIS — I10 Essential (primary) hypertension: Secondary | ICD-10-CM

## 2018-11-18 LAB — BASIC METABOLIC PANEL
BUN: 20 mg/dL (ref 6–23)
CO2: 30 mEq/L (ref 19–32)
Calcium: 9.8 mg/dL (ref 8.4–10.5)
Chloride: 101 mEq/L (ref 96–112)
Creatinine, Ser: 0.74 mg/dL (ref 0.40–1.20)
GFR: 101.9 mL/min (ref >60.00)
Glucose, Bld: 94 mg/dL (ref 70–99)
Potassium: 4.3 mEq/L (ref 3.5–5.1)
Sodium: 139 mEq/L (ref 135–145)

## 2018-11-18 NOTE — Progress Notes (Signed)
Pre visit review using our clinic tool,if applicable. No additional management support is needed unless otherwise documented below in the visit note.   Pt here for Blood pressure check per order from M. Inda Castle, NP.  Pt currently takes:  Benazipril 20-12.5mg    Pt reports compliance with medication.  BP today @ = 126/85 HR = 96  Pt advised per Debbrah Alar, BMET today and follow up with provider in 3 months.

## 2018-11-20 NOTE — Progress Notes (Signed)
Reviewed and agree.  Angell Pincock S O'Sullivan NP 

## 2018-11-26 ENCOUNTER — Other Ambulatory Visit: Payer: Self-pay | Admitting: Family

## 2018-11-26 DIAGNOSIS — Z0000 Encounter for general adult medical examination without abnormal findings: Secondary | ICD-10-CM

## 2019-01-06 ENCOUNTER — Ambulatory Visit (INDEPENDENT_AMBULATORY_CARE_PROVIDER_SITE_OTHER): Payer: BC Managed Care – PPO | Admitting: Family

## 2019-01-06 DIAGNOSIS — Z23 Encounter for immunization: Secondary | ICD-10-CM | POA: Diagnosis not present

## 2019-01-06 NOTE — Progress Notes (Signed)
Note reviewed and agree.  Nance Pear NP

## 2019-01-06 NOTE — Progress Notes (Signed)
Pt here for 2nd shingles vaccine and Blood pressure check per order from M. Inda Castle, NP.  Pt currently takes:  Benazipril 20-12.5mg   Has not taken any medication today.  Has not missed any doses.  Pt reports compliance with medication.  BP today @ = 134/86 HR = 80  Pt advised per Debbrah Alar to continue same dose and follow up next month.    Appointment made for 02/17/19   Shingles vaccine given and patient tolerated well.

## 2019-02-17 ENCOUNTER — Other Ambulatory Visit: Payer: Self-pay

## 2019-02-17 ENCOUNTER — Ambulatory Visit (INDEPENDENT_AMBULATORY_CARE_PROVIDER_SITE_OTHER): Payer: BC Managed Care – PPO | Admitting: Family

## 2019-02-17 DIAGNOSIS — E876 Hypokalemia: Secondary | ICD-10-CM

## 2019-02-17 DIAGNOSIS — M8580 Other specified disorders of bone density and structure, unspecified site: Secondary | ICD-10-CM

## 2019-02-17 DIAGNOSIS — I10 Essential (primary) hypertension: Secondary | ICD-10-CM | POA: Diagnosis not present

## 2019-02-17 NOTE — Progress Notes (Signed)
Virtual Visit via Video Note  I connected with Courtney Keller on 02/17/19 at  8:00 AM EDT by a video enabled telemedicine application and verified that I am speaking with the correct person using two identifiers.  The patient was at home and I was at home at the time of this visit.    I discussed the limitations of evaluation and management by telemedicine and the availability of in person appointments. The patient expressed understanding and agreed to proceed.  History of Present Illness: Patient is a 63 yr old female who presents today for follow up of her hypertension.  Blood pressure medication includes:  Benazapril 20-12.5. Denies LE edema, sob or chest pain.   BP now is 148/103  BP Readings from Last 3 Encounters:  11/18/18 126/85  11/03/18 (!) 158/98  08/13/18 127/85   Osteopenia- continues caltrate.     Observations/Objective:   Gen: Awake, alert, no acute distress Resp: Breathing is even and non-labored Psych: calm/pleasant demeanor Neuro: Alert and Oriented x 3, + facial symmetry, speech is clear.   Assessment and Plan:  HTN- bp was a little elevated today. She reports that her typical bp readings are better than this reading. Advised pt to check bp once daily for 1 week then send me her readings via mychart.    Osteopenia- continue caltrate.  Hypokalemia- continues kdur.  Plan repeat bmet in 3 months when she returns to the office for face to face visit.     Follow Up Instructions:    I discussed the assessment and treatment plan with the patient. The patient was provided an opportunity to ask questions and all were answered. The patient agreed with the plan and demonstrated an understanding of the instructions.   The patient was advised to call back or seek an in-person evaluation if the symptoms worsen or if the condition fails to improve as anticipated.  Nance Pear, NP

## 2019-03-09 ENCOUNTER — Encounter: Payer: Self-pay | Admitting: Family

## 2019-05-02 ENCOUNTER — Other Ambulatory Visit: Payer: Self-pay | Admitting: Family

## 2019-05-21 ENCOUNTER — Other Ambulatory Visit: Payer: Self-pay | Admitting: Family

## 2019-05-21 DIAGNOSIS — Z0000 Encounter for general adult medical examination without abnormal findings: Secondary | ICD-10-CM

## 2019-06-29 ENCOUNTER — Encounter: Payer: Self-pay | Admitting: Family

## 2019-07-01 ENCOUNTER — Other Ambulatory Visit: Payer: Self-pay

## 2019-07-03 ENCOUNTER — Encounter: Payer: Self-pay | Admitting: Family

## 2019-07-03 ENCOUNTER — Ambulatory Visit: Payer: BC Managed Care – PPO | Admitting: Family

## 2019-07-03 ENCOUNTER — Other Ambulatory Visit: Payer: Self-pay

## 2019-07-03 VITALS — BP 139/83 | HR 94 | Temp 98.4°F | Resp 16 | Ht 63.0 in | Wt 145.2 lb

## 2019-07-03 DIAGNOSIS — I10 Essential (primary) hypertension: Secondary | ICD-10-CM

## 2019-07-03 DIAGNOSIS — M25511 Pain in right shoulder: Secondary | ICD-10-CM | POA: Diagnosis not present

## 2019-07-03 LAB — BASIC METABOLIC PANEL
BUN: 13 mg/dL (ref 6–23)
CO2: 30 mEq/L (ref 19–32)
Calcium: 10.1 mg/dL (ref 8.4–10.5)
Chloride: 102 mEq/L (ref 96–112)
Creatinine, Ser: 0.74 mg/dL (ref 0.40–1.20)
GFR: 95.69 mL/min (ref >60.00)
Glucose, Bld: 103 mg/dL — ABNORMAL HIGH (ref 70–99)
Potassium: 3.5 mEq/L (ref 3.5–5.1)
Sodium: 140 mEq/L (ref 135–145)

## 2019-07-03 MED ORDER — MELOXICAM 7.5 MG PO TABS: 7.5000 mg | ORAL_TABLET | Freq: Every day | ORAL | 0 refills | Status: DC

## 2019-07-03 NOTE — Progress Notes (Signed)
Subjective:    Patient ID: Courtney Keller, female    DOB: August 01, 1956, 63 y.o.   MRN: [id number]  HPI  Patient is a 63 yr old female who presents today with chief complaint of right shoulder pain. She tried advil and a topical otc cream with minor improvement in her pain.  She is right handed.    HTN- maintained on lotensin hct BP Readings from Last 3 Encounters:  07/03/19 139/83  11/18/18 126/85  11/03/18 (!) 158/98      Review of Systems    see HPI  Past Medical History:  Diagnosis Date  . Breast cancer ([place])   . Cancer ([place])   . Hypertension      Social History   Socioeconomic History  . Marital status: Married    Spouse name: Not on file  . Number of children: Not on file  . Years of education: Not on file  . Highest education level: Not on file  Occupational History  . Not on file  Social Needs  . Financial resource strain: Not on file  . Food insecurity    Worry: Not on file    Inability: Not on file  . Transportation needs    Medical: Not on file    Non-medical: Not on file  Tobacco Use  . Smoking status: Never Smoker  . Smokeless tobacco: Never Used  Substance and Sexual Activity  . Alcohol use: Yes    Alcohol/week: 5.0 standard drinks    Types: 5 Glasses of wine per week    Comment: 5 glasses of wine a week   . Drug use: No  . Sexual activity: Not on file  Lifestyle  . Physical activity    Days per week: Not on file    Minutes per session: Not on file  . Stress: Not on file  Relationships  . Social Herbalist on phone: Not on file    Gets together: Not on file    Attends religious service: Not on file    Active member of club or organization: Not on file    Attends meetings of clubs or organizations: Not on file    Relationship status: Not on file  . Intimate partner violence    Fear of current or ex partner: Not on file    Emotionally abused: Not on file    Physically abused: Not on file    Forced sexual activity:  Not on file  Other Topics Concern  . Not on file  Social History Narrative   1 daughter- [name]- born 26.   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 ABDOMINAL HYSTERECTOMY  2006  . BREAST LUMPECTOMY Left 2006  . COLONOSCOPY     colon 10 + years ago normal per pt. cannot remember where it was done  . MYOMECTOMY      Family History  Problem Relation Age of Onset  . Hypertension Mother   . Cancer Mother 39       breast cancer  . Breast cancer Mother   . Hypertension Maternal Grandmother   . Colon cancer Neg Hx   . Rectal cancer Neg Hx   . Stomach cancer Neg Hx     No Known Allergies  Current Outpatient Medications on File Prior to Visit  Medication Sig Dispense Refill  . benazepril-hydrochlorthiazide (LOTENSIN HCT) 20-12.5 MG tablet TAKE 1 TABLET BY MOUTH EVERY DAY 90 tablet 1  . calcium carbonate (OS-CAL) 600 MG TABS Take 600 mg by mouth 2 (two) times daily with a meal.      . potassium chloride (KLOR-CON M10) 10 MEQ tablet TAKE 2 TABLETS BY MOUTH EVERY DAY 180 tablet 1   No current facility-administered medications on file prior to visit.     BP 139/83 (BP Location: Right Arm, Patient Position: Sitting, Cuff Size: Small)   Pulse 94   Temp 98.4 F (36.9 C) (Oral)   Resp 16   Ht 5\' 3"  (1.6 m)   Wt 145 lb 3.2 oz (65.9 kg)   SpO2 100%   BMI 25.72 kg/m    Objective:   Physical Exam Constitutional:      Appearance: She is well-developed.  Neck:     Musculoskeletal: Neck supple.     Thyroid: No thyromegaly.  Cardiovascular:     Rate and Rhythm: Normal rate and regular rhythm.     Heart sounds: Normal heart sounds. No murmur.  Pulmonary:     Effort: Pulmonary effort is normal. No respiratory distress.     Breath sounds: Normal breath sounds. No wheezing.  Musculoskeletal:     Comments: Right shoulder is without  tenderness to palpation or swelling.  Full ROM noted but pt has increased pain with right shoulder abduction  Skin:    General: Skin is warm and dry.  Neurological:     Mental Status: She is alert and oriented to person, place, and time.  Psychiatric:        Behavior: Behavior normal.        Thought Content: Thought content normal.        Judgment: Judgment normal.           Assessment & Plan:  HTN-  bp stable on current medication. Continue same.  Obtain follow up bmet.  R shoulder pain- likely rotator cuff strain. Advised trial of meloxicam once daily. Pt is advised to call if symptoms worsen or if symptoms fail to improve.  Pt verbalizes understanding.  Would plan referral to sports medicine at that time.

## 2019-07-03 NOTE — Patient Instructions (Addendum)
Please begin meloxicam once daily for right shoulder pain. Call if symptoms worsen or if not improved in  1-2 weeks and we will refer you to sports medicine.

## 2019-07-22 ENCOUNTER — Encounter: Payer: Self-pay | Admitting: Family

## 2019-07-22 DIAGNOSIS — M25511 Pain in right shoulder: Secondary | ICD-10-CM

## 2019-07-23 ENCOUNTER — Ambulatory Visit: Payer: BC Managed Care – PPO | Admitting: Family Medicine

## 2019-07-23 ENCOUNTER — Ambulatory Visit: Payer: Self-pay

## 2019-07-23 ENCOUNTER — Encounter: Payer: Self-pay | Admitting: Family Medicine

## 2019-07-23 ENCOUNTER — Other Ambulatory Visit: Payer: Self-pay

## 2019-07-23 VITALS — Ht 63.0 in | Wt 145.0 lb

## 2019-07-23 DIAGNOSIS — M7501 Adhesive capsulitis of right shoulder: Secondary | ICD-10-CM

## 2019-07-23 HISTORY — DX: Adhesive capsulitis of right shoulder: M75.01

## 2019-07-23 MED ORDER — IBUPROFEN-FAMOTIDINE 800-26.6 MG PO TABS: 1.0000 | ORAL_TABLET | Freq: Three times a day (TID) | ORAL | 3 refills | Status: DC

## 2019-07-23 NOTE — Patient Instructions (Signed)
Nice to meet you Please try heat then exercise and then ice.  You may want to try an duexis before working out  Please perform the exercises   Please send me a message in MyChart with any questions or updates.  Please see me back in 4 weeks or sooner if you want to try an injection.   --Dr. Raeford Razor

## 2019-07-23 NOTE — Progress Notes (Signed)
Courtney Keller - 63 y.o. female MRN [id number]  Date of birth: 08-01-1956  SUBJECTIVE:  Including CC & ROS.  Chief Complaint  Patient presents with  . Shoulder Pain    right shoulder x 1 month    Courtney Keller is a 63 y.o. female that is presenting with right shoulder pain.  The pain is acute on chronic in nature.  The pain started a few months ago and has progressed.  She is tried medications with limited improvement.  She has trouble reaching out for different objects.  The pain is localized to the shoulder.  Denies any specific inciting event.  No history of surgery.  No radicular symptoms.  Pain is throbbing and sharp in nature.   Review of Systems  Constitutional: Negative for fever.  HENT: Negative for congestion.   Respiratory: Negative for cough.   Cardiovascular: Negative for chest pain.  Gastrointestinal: Negative for abdominal pain.  Musculoskeletal: Positive for joint swelling.  Skin: Negative for color change.  Neurological: Negative for weakness.  Hematological: Negative for adenopathy.    HISTORY: Past Medical, Surgical, Social, and Family History Reviewed & Updated per EMR.   Pertinent Historical Findings include:  Past Medical History:  Diagnosis Date  . 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 Breast cancer ([place])   . Cancer ([place])   . Hypertension     Past Surgical History:  Procedure Laterality Date  . ABDOMINAL HYSTERECTOMY  2006  . BREAST LUMPECTOMY Left 2006  . COLONOSCOPY     colon 10 + years ago normal per pt. cannot remember where it was done  . MYOMECTOMY      No Known Allergies  Family History  Problem Relation Age of Onset  . Hypertension Mother   . Cancer Mother 82       breast cancer  . Breast cancer Mother   . Hypertension Maternal Grandmother   . Colon cancer Neg Hx   . Rectal cancer Neg Hx   . Stomach cancer Neg Hx      Social History   Socioeconomic History  . Marital status: Married    Spouse  name: Not on file  . Number of children: Not on file  . Years of education: Not on file  . Highest education level: Not on file  Occupational History  . Not on file  Social Needs  . Financial resource strain: Not on file  . Food insecurity    Worry: Not on file    Inability: Not on file  . Transportation needs    Medical: Not on file    Non-medical: Not on file  Tobacco Use  . Smoking status: Never Smoker  . Smokeless tobacco: Never Used  Substance and Sexual Activity  . Alcohol use: Yes    Alcohol/week: 5.0 standard drinks    Types: 5 Glasses of wine per week    Comment: 5 glasses of wine a week   . Drug use: No  . Sexual activity: Not on file  Lifestyle  . Physical activity    Days per week: Not on file    Minutes per session: Not on file  . Stress: Not on file  Relationships  . Social Herbalist on phone: Not on file    Gets together: Not on file    Attends religious service: Not on file    Active member of club or organization: Not on file    Attends meetings of clubs or organizations: Not  on file    Relationship status: Not on file  . Intimate partner violence    Fear of current or ex partner: Not on file    Emotionally abused: Not on file    Physically abused: Not on file    Forced sexual activity: Not on file  Other Topics Concern  . Not on file  Social History Narrative   1 daughter- [name]- born 14.   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 Normal internal rotation. Normal strength to resistance. Limited abduction. No significant pain with empty can testing. Normal grip strength. Neurovascularly intact  Limited ultrasound: Right shoulder:  Normal-appearing biceps tendon. Supraspinatus appears to be normal dynamic testing. Axillary views shows thickening of the labrum on the right side when compared to the left  Summary: Findings suggestive of frozen shoulder capsulitis.  Ultrasound and interpretation by Clearance Coots, MD      ASSESSMENT & PLAN:   Adhesive capsulitis of right shoulder Clinical exam and ultrasound are consistent with frozen shoulder.  No significant pain today. -Duexis and samples provided. -Counseled on home excise therapy and supportive care. -If no improvement will consider physical therapy, injection or imaging.

## 2019-07-23 NOTE — Assessment & Plan Note (Signed)
Clinical exam and ultrasound are consistent with frozen shoulder.  No significant pain today. -Duexis and samples provided. -Counseled on home excise therapy and supportive care. -If no improvement will consider physical therapy, injection or imaging.

## 2019-07-23 NOTE — Progress Notes (Signed)
Medication Samples have been provided to the patient.  Drug name: Duexis       Strength: 800mg /26.6mg       Qty: 1 Box  [id number]  Exp.Date: 10/2020  Dosing instructions: take 1 tablet by mouth three (3) times a day.  The patient has been instructed regarding the correct time, dose, and frequency of taking this medication, including desired effects and most common side effects.   Sherrie George, [place] 9:28 AM 07/23/2019

## 2019-08-20 ENCOUNTER — Encounter: Payer: Self-pay | Admitting: Family Medicine

## 2019-08-20 ENCOUNTER — Ambulatory Visit: Payer: BC Managed Care – PPO | Admitting: Family Medicine

## 2019-08-20 ENCOUNTER — Other Ambulatory Visit: Payer: Self-pay

## 2019-08-20 DIAGNOSIS — M7501 Adhesive capsulitis of right shoulder: Secondary | ICD-10-CM

## 2019-08-20 NOTE — Assessment & Plan Note (Signed)
Range of motion still limited and throbbing at night.  - continue HEP and supportive care  - counseled on tylenol use  - if no improvement consider imaging, injection or PT.

## 2019-08-20 NOTE — Progress Notes (Signed)
Courtney Keller - 63 y.o. female MRN [id number]  Date of birth: Jul 24, 1956  SUBJECTIVE:  Including CC & ROS.  Chief Complaint  Patient presents with  . Follow-up    follow up for right shoulder    Courtney Keller is a 63 y.o. female that is following up for her adhesive capsulitis.  She has been taking the Duexis but does not notice any significant improvement of her pain.  The pain seems to be throbbing at night and she also endorses some soreness over the lateral shoulder.  She has been doing the range of motion exercises and uses heat and ice as needed.  The range of motion is still not where it needs to be.   Review of Systems  Constitutional: Negative for fever.  HENT: Negative for congestion.   Respiratory: Negative for cough.   Cardiovascular: Negative for chest pain.  Gastrointestinal: Negative for abdominal pain.  Musculoskeletal: Negative for gait problem.  Skin: Negative for color change.  Neurological: Negative for tremors.  Hematological: Negative for adenopathy.    HISTORY: Past Medical, Surgical, Social, and Family History Reviewed & Updated per EMR.   Pertinent Historical Findings include:  Past Medical History:  Diagnosis Date  . 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 Breast cancer ([place])   . Cancer ([place])   . Hypertension     Past Surgical History:  Procedure Laterality Date  . ABDOMINAL HYSTERECTOMY  2006  . BREAST LUMPECTOMY Left 2006  . COLONOSCOPY     colon 10 + years ago normal per pt. cannot remember where it was done  . MYOMECTOMY      No Known Allergies  Family History  Problem Relation Age of Onset  . Hypertension Mother   . Cancer Mother 90       breast cancer  . Breast cancer Mother   . Hypertension Maternal Grandmother   . Colon cancer Neg Hx   . Rectal cancer Neg Hx   . Stomach cancer Neg Hx      Social History   Socioeconomic History  . Marital status: Married    Spouse name: Not on file  .  Number of children: Not on file  . Years of education: Not on file  . Highest education level: Not on file  Occupational History  . Not on file  Social Needs  . Financial resource strain: Not on file  . Food insecurity    Worry: Not on file    Inability: Not on file  . Transportation needs    Medical: Not on file    Non-medical: Not on file  Tobacco Use  . Smoking status: Never Smoker  . Smokeless tobacco: Never Used  Substance and Sexual Activity  . Alcohol use: Yes    Alcohol/week: 5.0 standard drinks    Types: 5 Glasses of wine per week    Comment: 5 glasses of wine a week   . Drug use: No  . Sexual activity: Not on file  Lifestyle  . Physical activity    Days per week: Not on file    Minutes per session: Not on file  . Stress: Not on file  Relationships  . Social Herbalist on phone: Not on file    Gets together: Not on file    Attends religious service: Not on file    Active member of club or organization: Not on file    Attends meetings of clubs or organizations: Not on file  Relationship status: Not on file  . Intimate partner violence    Fear of current or ex partner: Not on file    Emotionally abused: Not on file    Physically abused: Not on file    Forced sexual activity: Not on file  Other Topics Concern  . Not on file  Social History Narrative   1 daughter- Courtney Keller- born 74.   Married   Works at [employer] and [employer], runs Armed forces technical officer degree   Enjoys puzzles           PHYSICAL EXAM:  VS: BP (!) 145/88   Pulse 84   Ht 5\' 3"  (1.6 m)   Wt 141 lb (64 kg)   BMI 24.98 kg/m  Physical Exam Gen: NAD, alert, cooperative with exam, well-appearing ENT: normal lips, normal nasal mucosa,  Eye: normal EOM, normal conjunctiva and lids CV:  no edema, +2 pedal pulses   Resp: no accessory muscle use, non-labored,  Skin: no rashes, no areas of induration  Neuro: normal tone, normal sensation to touch Psych:   normal insight, alert and oriented MSK:  Right shoulder:  Limited active flexion and abduction  Limited ER  Normal IR  NVI       ASSESSMENT & PLAN:   Adhesive capsulitis of right shoulder Range of motion still limited and throbbing at night.  - continue HEP and supportive care  - counseled on tylenol use  - if no improvement consider imaging, injection or PT.

## 2019-09-04 ENCOUNTER — Other Ambulatory Visit: Payer: Self-pay

## 2019-09-04 ENCOUNTER — Ambulatory Visit (INDEPENDENT_AMBULATORY_CARE_PROVIDER_SITE_OTHER): Payer: BC Managed Care – PPO

## 2019-09-04 DIAGNOSIS — Z23 Encounter for immunization: Secondary | ICD-10-CM

## 2019-09-27 IMAGING — MG DIGITAL DIAGNOSTIC UNILATERAL LEFT MAMMOGRAM WITH TOMO AND CAD
6 series · 6 of 18 positions shown · non-contrast
Comparison: Previous exam(s).

CLINICAL DATA: 61-year-old female recalled from screening mammogram
dated 08/20/2018 for possible left breast distortion.

EXAM:
DIGITAL DIAGNOSTIC LEFT MAMMOGRAM WITH CAD AND TOMO
ULTRASOUND LEFT BREAST

[L MLO synth-2D]
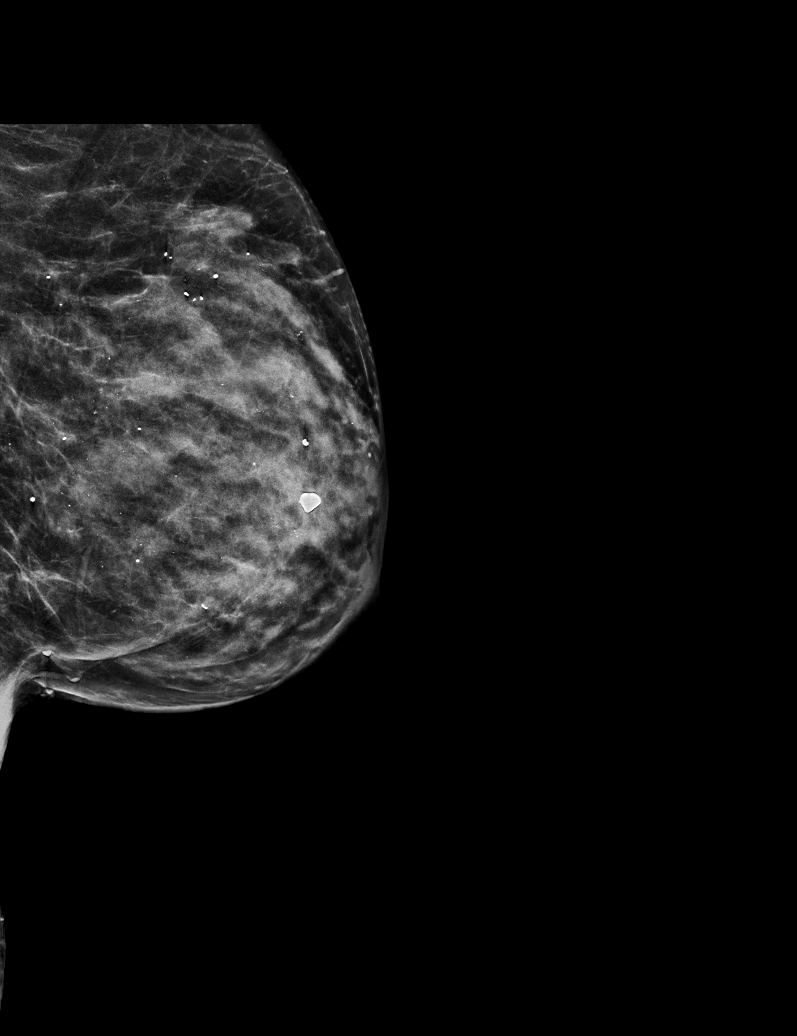

[L CC synth-2D (1 of 2)]
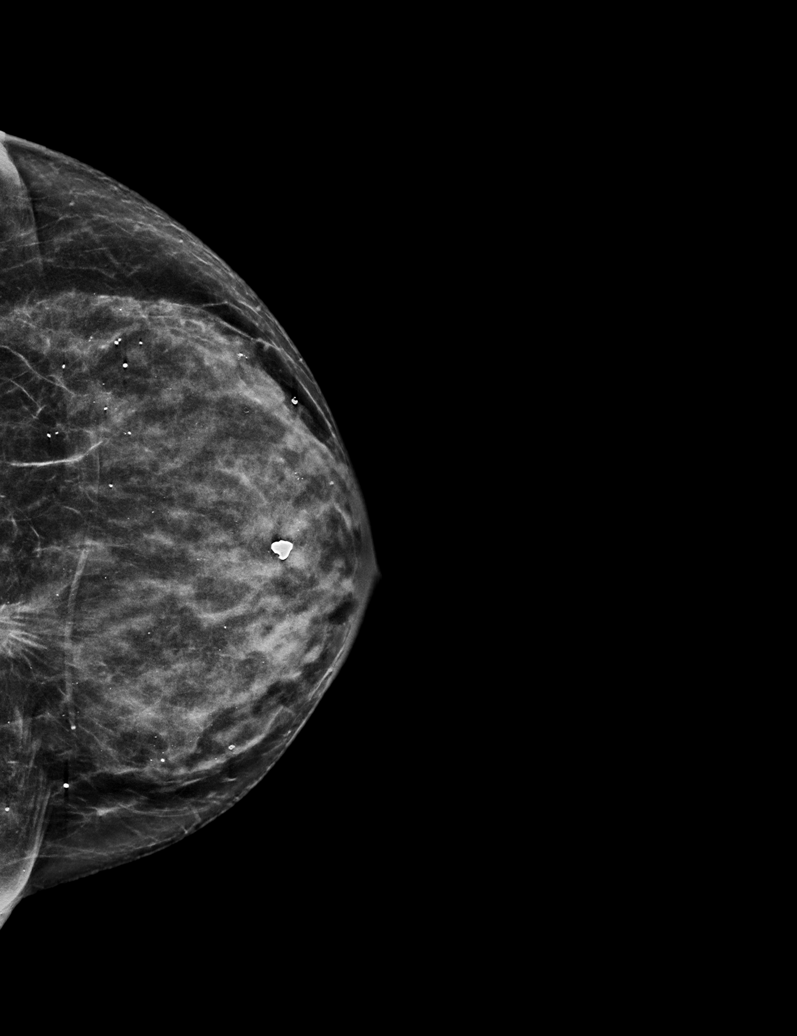

[L CC synth-2D (2 of 2)]
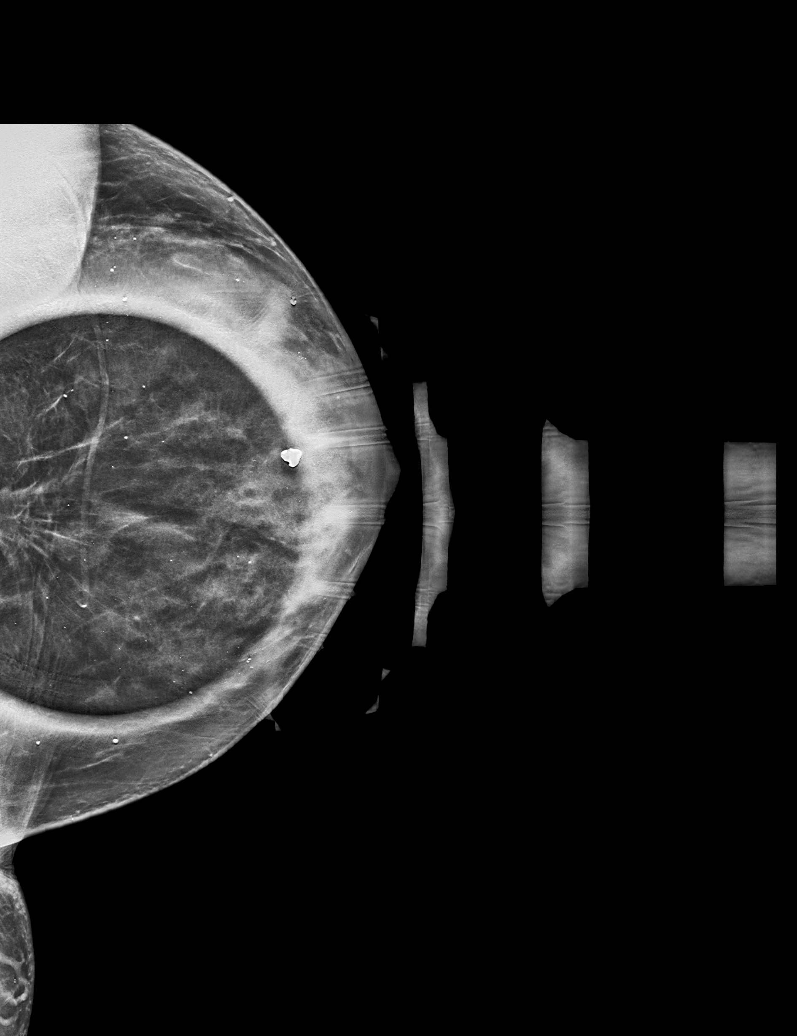

[L CC tomo (1 of 2) · tomo slice 27/53.0]
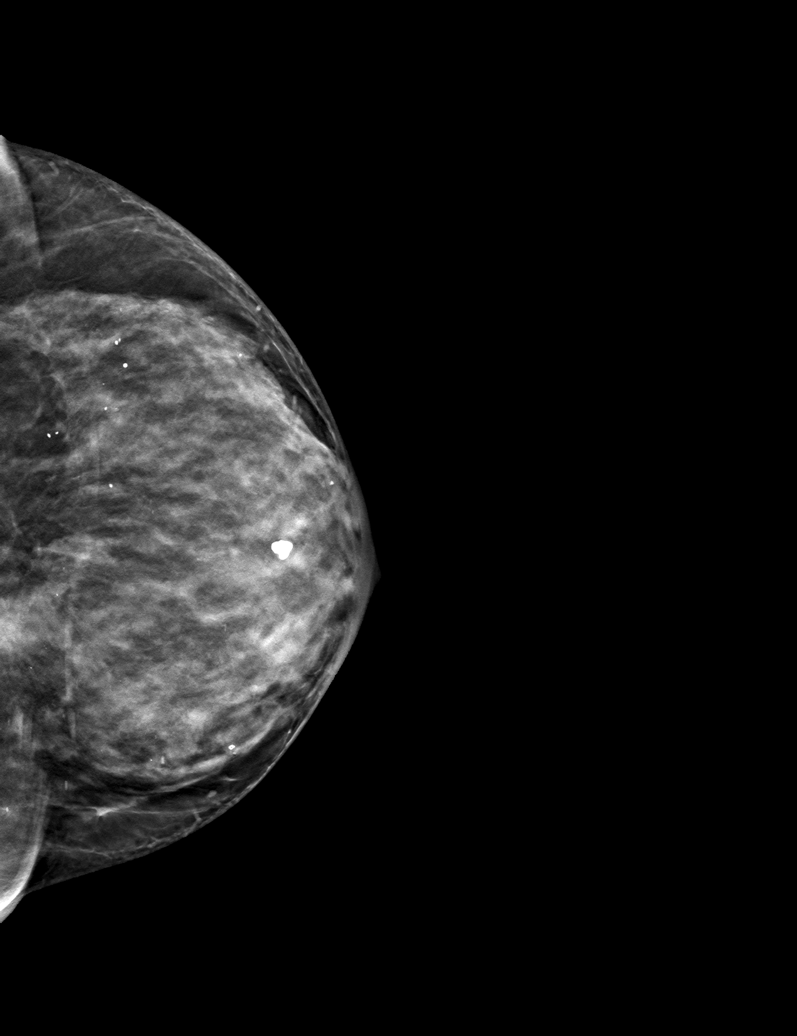

[L CC tomo (2 of 2) · tomo slice 27/53.0]
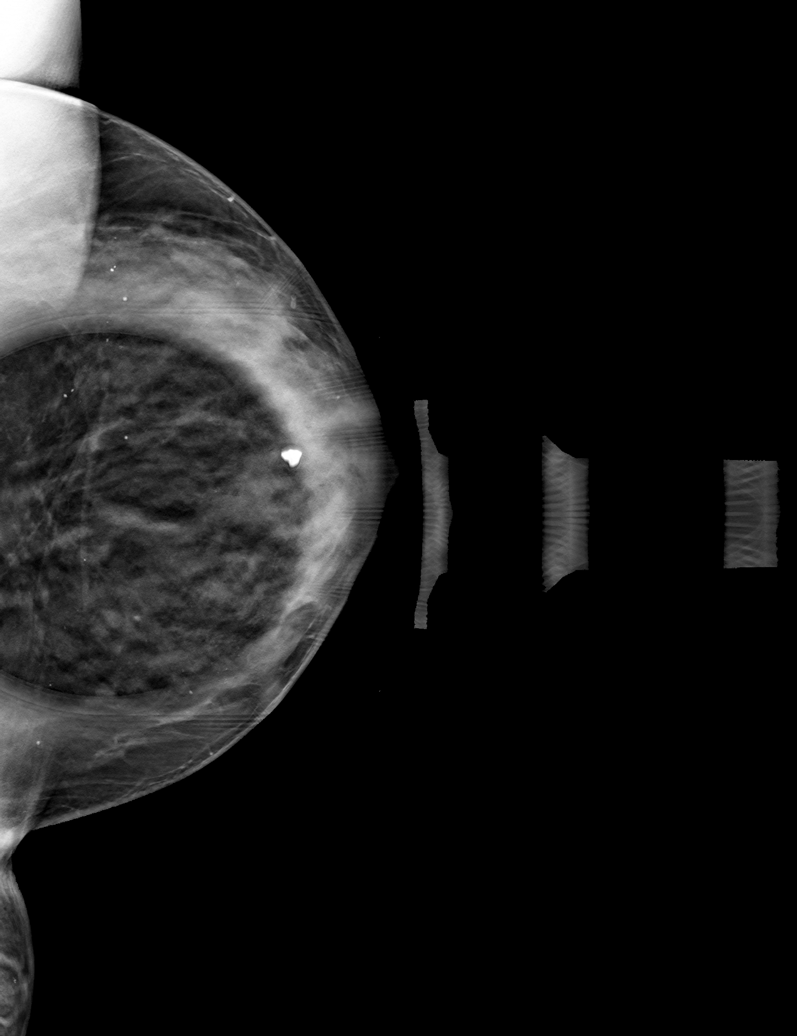

[L MLO tomo · tomo slice 28/55.0]
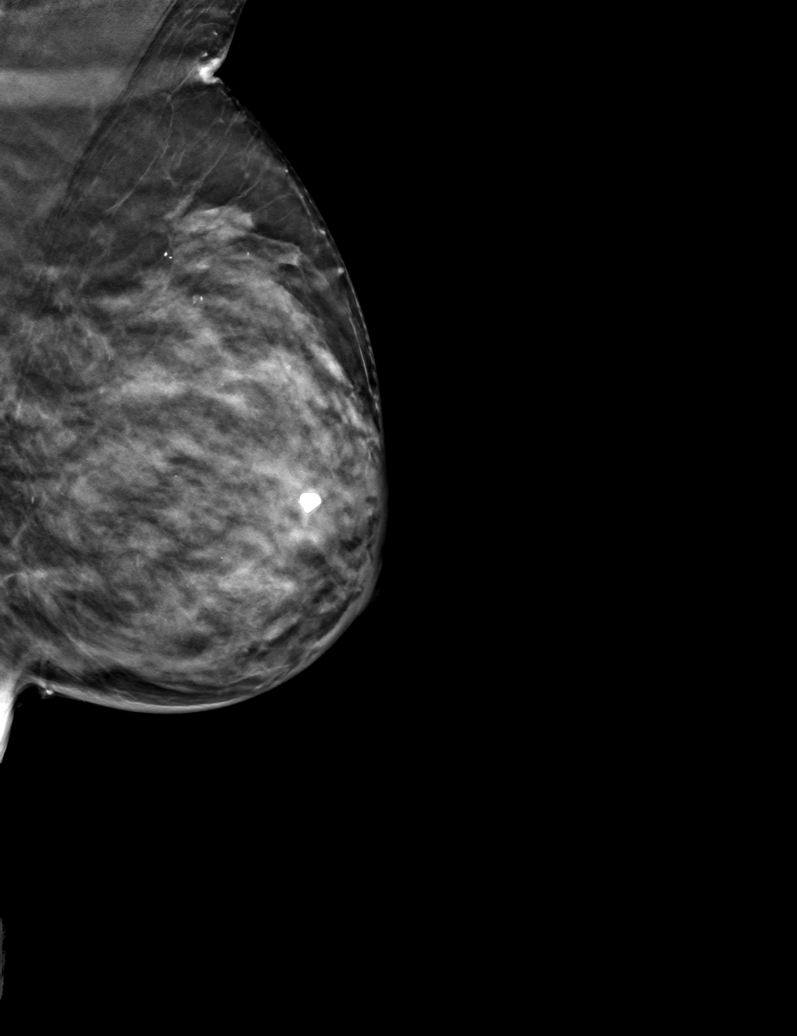

[6 of 18 positions shown; findings below may reference images not displayed]

ACR Breast Density Category c: The breast tissue is heterogeneously
dense, which may obscure small masses.
FINDINGS: Persistent with distortion with associated density is demonstrated
in the far posterior central left breast. This localizes inferiorly
on tomosynthesis. The area is deep to scar marker at site of the
patient's prior malignant lumpectomy. The distortion may be seen on
prior remote mammograms but is not completely evaluated due to the
location. Further evaluation with ultrasound was performed.

Mammographic images were processed with CAD.

On physical exam, overlying skin scar is demonstrated along the
inframammary fold on the left.

Targeted ultrasound is performed, showing an irregular hypoechoic
mass along the medial portion of the patient's postsurgical scar.
This is localized to the 8 o'clock position 4 cm from the nipple.
Overall measurements are 13 x 12 x 10 mm. There is no definite
associated vascularity.

Evaluation of the left axilla demonstrates no suspicious
lymphadenopathy.
IMPRESSION: Indeterminate left breast mass corresponding with the screening
mammographic findings. Findings may represent postsurgical changes
from the patient's remote malignant lumpectomy. However, given the
imaging characteristics, definitive tissue diagnosis is recommended.

RECOMMENDATION:
Ultrasound-guided biopsy of the left breast.

I have discussed the findings and recommendations with the patient.
Results were also provided in writing at the conclusion of the
visit. If applicable, a reminder letter will be sent to the patient
regarding the next appointment.

BI-RADS CATEGORY  4: Suspicious.

## 2019-09-27 IMAGING — US ULTRASOUND LEFT BREAST LIMITED
1 series · 7 of 7 positions shown · non-contrast
Comparison: Previous exam(s).

CLINICAL DATA: 61-year-old female recalled from screening mammogram
dated 08/20/2018 for possible left breast distortion.

EXAM:
DIGITAL DIAGNOSTIC LEFT MAMMOGRAM WITH CAD AND TOMO
ULTRASOUND LEFT BREAST

[Series 1: ultrasound left breast limited · 0.06mm/px · 7 of 7 slices shown]
[im 1/7]
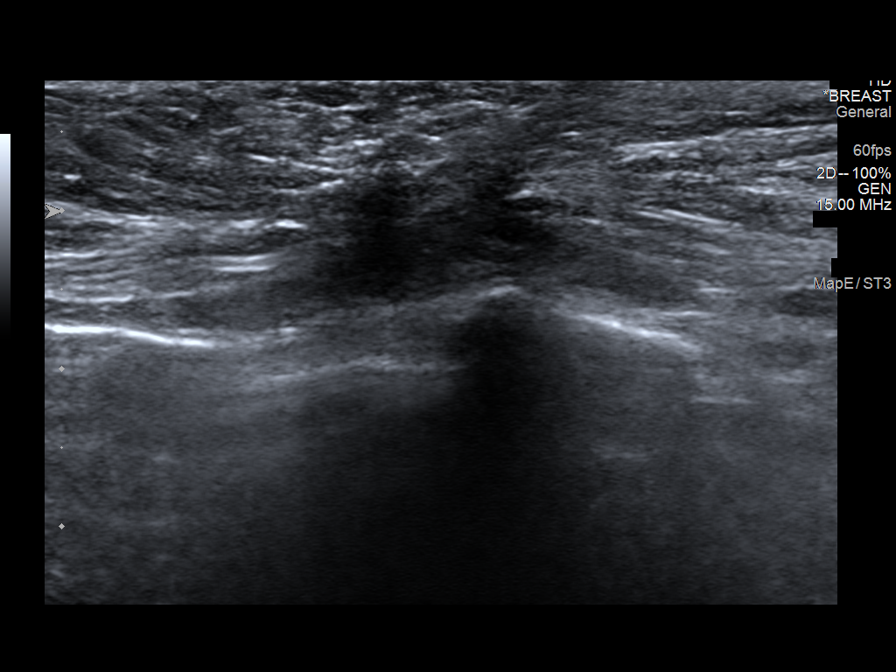
[im 2/7]
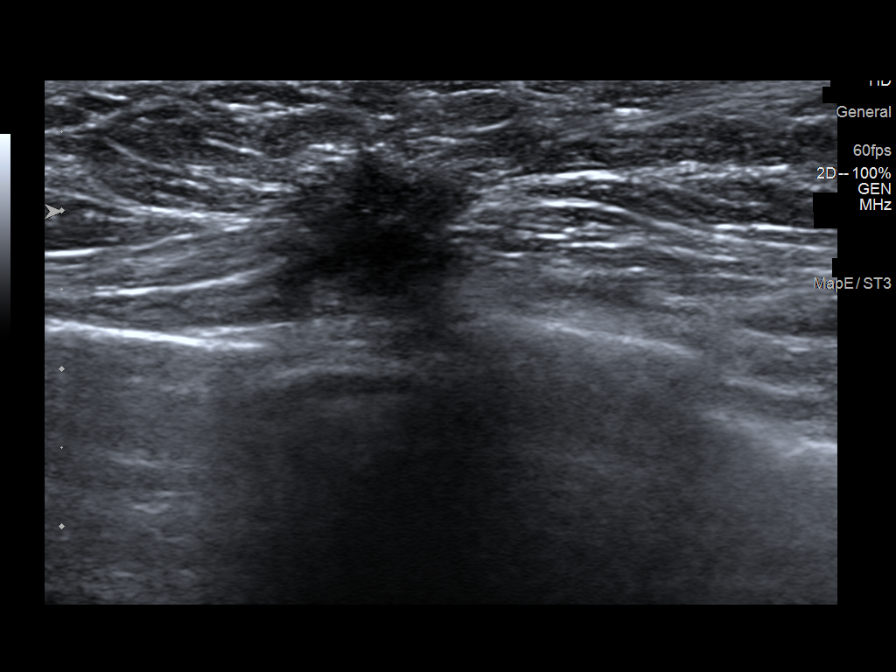
[im 3/7]
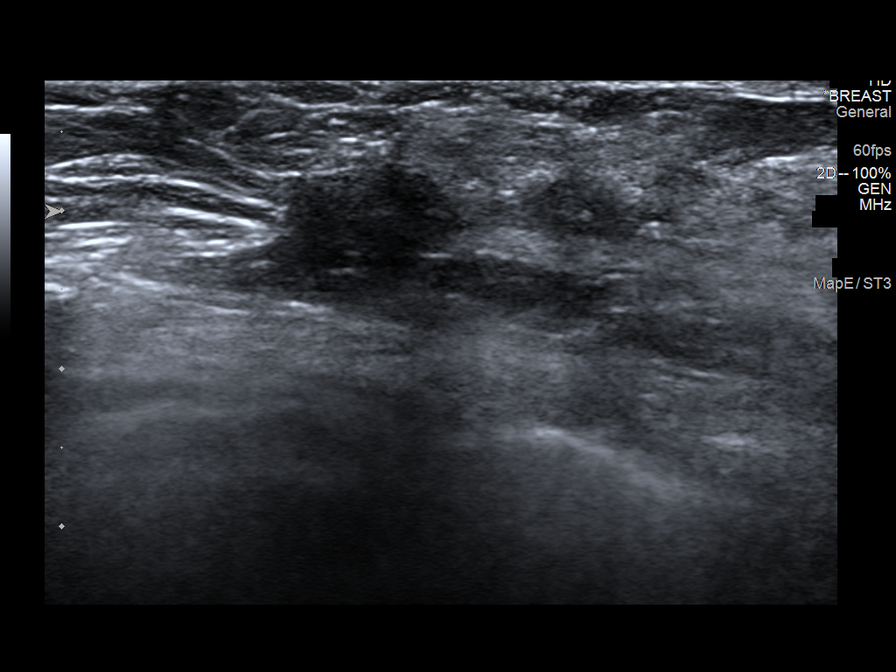
[im 4/7]
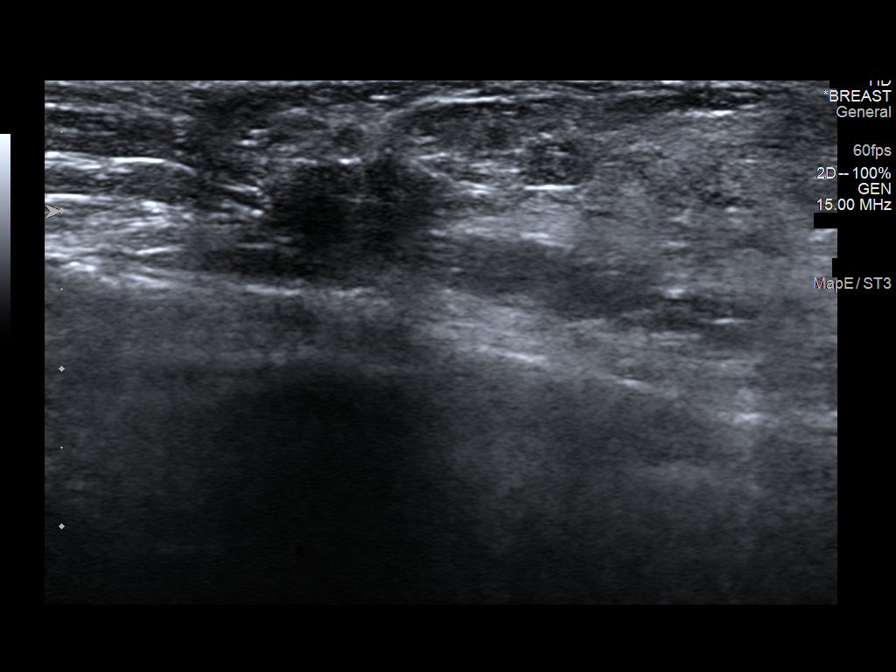
[im 5/7]
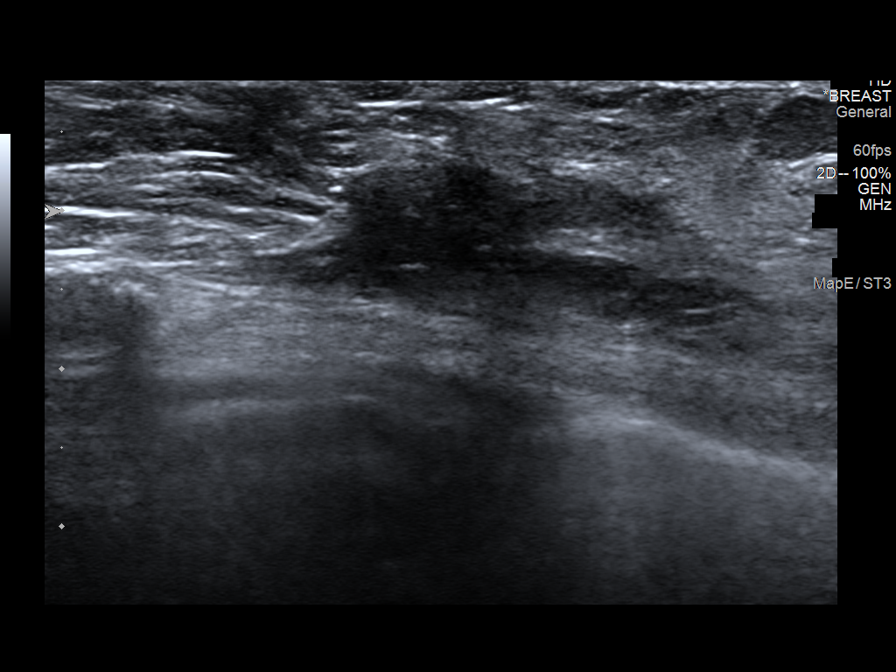
[im 6/7]
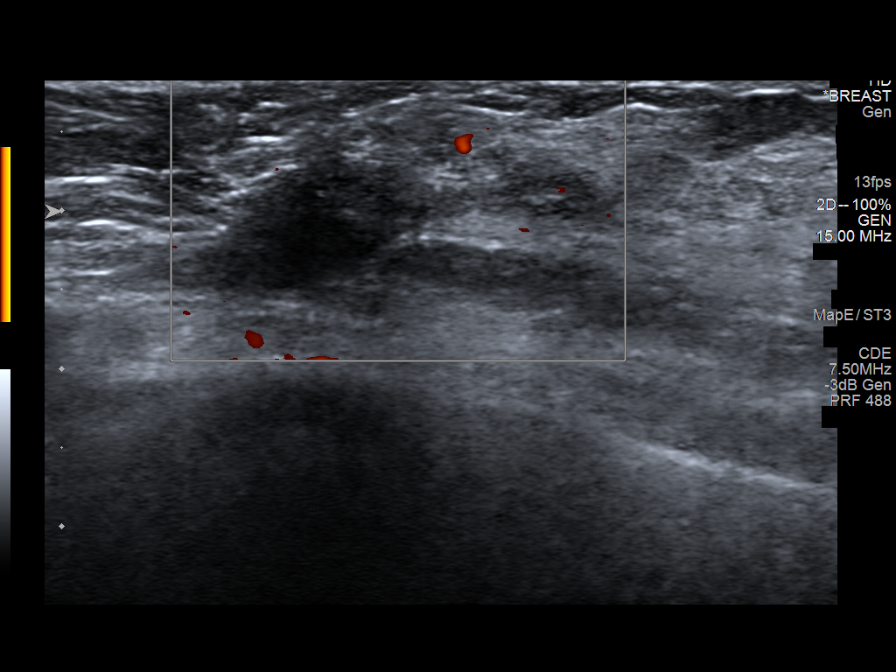
[im 7/7]
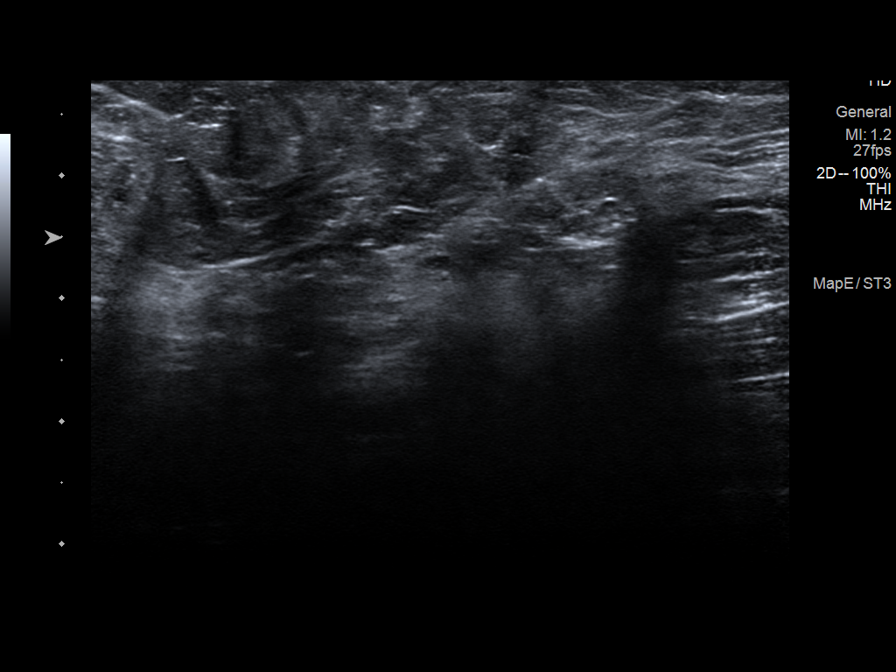

[7 of 7 positions shown; findings below may reference images not displayed]

ACR Breast Density Category c: The breast tissue is heterogeneously
dense, which may obscure small masses.
FINDINGS: Persistent with distortion with associated density is demonstrated
in the far posterior central left breast. This localizes inferiorly
on tomosynthesis. The area is deep to scar marker at site of the
patient's prior malignant lumpectomy. The distortion may be seen on
prior remote mammograms but is not completely evaluated due to the
location. Further evaluation with ultrasound was performed.

Mammographic images were processed with CAD.

On physical exam, overlying skin scar is demonstrated along the
inframammary fold on the left.

Targeted ultrasound is performed, showing an irregular hypoechoic
mass along the medial portion of the patient's postsurgical scar.
This is localized to the 8 o'clock position 4 cm from the nipple.
Overall measurements are 13 x 12 x 10 mm. There is no definite
associated vascularity.

Evaluation of the left axilla demonstrates no suspicious
lymphadenopathy.
IMPRESSION: Indeterminate left breast mass corresponding with the screening
mammographic findings. Findings may represent postsurgical changes
from the patient's remote malignant lumpectomy. However, given the
imaging characteristics, definitive tissue diagnosis is recommended.

RECOMMENDATION:
Ultrasound-guided biopsy of the left breast.

I have discussed the findings and recommendations with the patient.
Results were also provided in writing at the conclusion of the
visit. If applicable, a reminder letter will be sent to the patient
regarding the next appointment.

BI-RADS CATEGORY  4: Suspicious.

## 2019-10-20 ENCOUNTER — Ambulatory Visit: Payer: BC Managed Care – PPO | Admitting: Family Medicine

## 2019-10-30 ENCOUNTER — Telehealth: Payer: Self-pay | Admitting: Family

## 2019-11-03 ENCOUNTER — Other Ambulatory Visit: Payer: Self-pay | Admitting: Family

## 2019-11-03 DIAGNOSIS — Z0000 Encounter for general adult medical examination without abnormal findings: Secondary | ICD-10-CM

## 2019-11-04 ENCOUNTER — Other Ambulatory Visit: Payer: Self-pay | Admitting: Family

## 2019-12-04 ENCOUNTER — Other Ambulatory Visit: Payer: Self-pay

## 2019-12-04 ENCOUNTER — Encounter: Payer: Self-pay | Admitting: Family

## 2019-12-04 ENCOUNTER — Ambulatory Visit (INDEPENDENT_AMBULATORY_CARE_PROVIDER_SITE_OTHER): Payer: BC Managed Care – PPO | Admitting: Family

## 2019-12-04 VITALS — BP 129/73 | HR 83 | Temp 97.0°F | Resp 16 | Ht 63.5 in | Wt 146.0 lb

## 2019-12-04 DIAGNOSIS — E785 Hyperlipidemia, unspecified: Secondary | ICD-10-CM | POA: Diagnosis not present

## 2019-12-04 DIAGNOSIS — Z0000 Encounter for general adult medical examination without abnormal findings: Secondary | ICD-10-CM | POA: Diagnosis not present

## 2019-12-04 DIAGNOSIS — I10 Essential (primary) hypertension: Secondary | ICD-10-CM | POA: Diagnosis not present

## 2019-12-04 LAB — COMPREHENSIVE METABOLIC PANEL
ALT: 9 U/L (ref 0–35)
AST: 21 U/L (ref 0–37)
Albumin: 4.3 g/dL (ref 3.5–5.2)
Alkaline Phosphatase: 43 U/L (ref 39–117)
BUN: 15 mg/dL (ref 6–23)
CO2: 30 mEq/L (ref 19–32)
Calcium: 9.6 mg/dL (ref 8.4–10.5)
Chloride: 102 mEq/L (ref 96–112)
Creatinine, Ser: 0.77 mg/dL (ref 0.40–1.20)
GFR: 91.27 mL/min (ref >60.00)
Glucose, Bld: 88 mg/dL (ref 70–99)
Potassium: 3.7 mEq/L (ref 3.5–5.1)
Sodium: 140 mEq/L (ref 135–145)
Total Bilirubin: 0.8 mg/dL (ref 0.2–1.2)
Total Protein: 7 g/dL (ref 6.0–8.3)

## 2019-12-04 LAB — LIPID PANEL
Cholesterol: 224 mg/dL — ABNORMAL HIGH (ref 0–200)
HDL: 68.1 mg/dL (ref >39.00)
LDL Cholesterol: 145 mg/dL — ABNORMAL HIGH (ref 0–99)
NonHDL: 155.63
Total CHOL/HDL Ratio: 3
Triglycerides: 53 mg/dL (ref 0.0–149.0)
VLDL: 10.6 mg/dL (ref 0.0–40.0)

## 2019-12-04 NOTE — Patient Instructions (Signed)
Please schedule routine dental visit.  Below are two ways to schedule a Covid-19 Vaccine:  Please visit [email or web address] to register or call [phone]  Or call:  [facility] Covid-19 vaccine scheduling at [phone]

## 2019-12-04 NOTE — Progress Notes (Signed)
Subjective:    Patient ID: Courtney Keller, female    DOB: Apr 05, 1956, 64 y.o.   MRN: [id number]  HPI   Patient presents today for complete physical.  Immunizations: tetanus 2015, flu shot up to date Diet: healthy Exercise: some walking Colonoscopy: 2016- due 2026 Dexa: 2019 Pap Smear: hysterectomy Mammogram: due Vision: 1 year ago Dental: due  Hypertension- maintained on lotensin-hct. And Kdur.  BP Readings from Last 3 Encounters:  12/04/19 129/73  08/20/19 (!) 145/88  07/03/19 139/83      Review of Systems Past Medical History:  Diagnosis Date  . 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 Breast cancer ([place])   . Cancer ([place])   . Hypertension      Social History   Socioeconomic History  . Marital status: Married    Spouse name: Not on file  . Number of children: Not on file  . Years of education: Not on file  . Highest education level: Not on file  Occupational History  . Not on file  Tobacco Use  . Smoking status: Never Smoker  . Smokeless tobacco: Never Used  Substance and Sexual Activity  . Alcohol use: Yes    Alcohol/week: 5.0 standard drinks    Types: 5 Glasses of wine per week    Comment: 5 glasses of wine a week   . Drug use: No  . Sexual activity: Not on file  Other Topics Concern  . Not on file  Social History Narrative   1 daughter- [name]- born 14.   Married   Works at [employer] and [employer], runs Armed forces technical officer degree   Enjoys puzzles         Social Determinants of Radio broadcast assistant Strain:   . Difficulty of Paying Living Expenses: Not on file  Food Insecurity:   . Worried About Charity fundraiser in the Last Year: Not on file  . Ran Out of Food in the Last Year: Not on file  Transportation Needs:   . Lack of Transportation (Medical): Not on file  . Lack of Transportation (Non-Medical): Not on file  Physical Activity:   . Days of Exercise per Week: Not on file  .  Minutes of Exercise per Session: Not on file  Stress:   . Feeling of Stress : Not on file  Social Connections:   . Frequency of Communication with Friends and Family: Not on file  . Frequency of Social Gatherings with Friends and Family: Not on file  . Attends Religious Services: Not on file  . Active Member of Clubs or Organizations: Not on file  . Attends Archivist Meetings: Not on file  . Marital Status: Not on file  Intimate Partner Violence:   . Fear of Current or Ex-Partner: Not on file  . Emotionally Abused: Not on file  . Physically Abused: Not on file  . Sexually Abused: Not on file    Past Surgical History:  Procedure Laterality Date  . ABDOMINAL HYSTERECTOMY  2006  . BREAST LUMPECTOMY Left 2006  . COLONOSCOPY     colon 10 + years ago normal per pt. cannot remember where it was done  . MYOMECTOMY      Family History  Problem Relation Age of Onset  . Hypertension Mother   . Cancer Mother 34       breast cancer  . Breast cancer Mother   . Hypertension Maternal Grandmother   .  Colon cancer Neg Hx   . Rectal cancer Neg Hx   . Stomach cancer Neg Hx     No Known Allergies  Current Outpatient Medications on File Prior to Visit  Medication Sig Dispense Refill  . benazepril-hydrochlorthiazide (LOTENSIN HCT) 20-12.5 MG tablet TAKE 1 TABLET BY MOUTH EVERY DAY 90 tablet 1  . calcium carbonate (OS-CAL) 600 MG TABS Take 600 mg by mouth 2 (two) times daily with a meal.      . meloxicam (MOBIC) 7.5 MG tablet Take 1 tablet (7.5 mg total) by mouth daily. 14 tablet 0  . potassium chloride (KLOR-CON M10) 10 MEQ tablet TAKE 2 TABLETS BY MOUTH EVERY DAY 180 tablet 1   No current facility-administered medications on file prior to visit.    BP 129/73 (BP Location: Right Arm, Patient Position: Sitting, Cuff Size: Small)   Pulse 83   Temp (!) 97 F (36.1 C) (Temporal)   Resp 16   Ht 5' 3.5" (1.613 m)   Wt 146 lb (66.2 kg)   SpO2 100%   BMI 25.46 kg/m         Objective:   Physical Exam  Physical Exam  Constitutional: She is oriented to person, place, and time. She appears well-developed and well-nourished. No distress.  HENT:  Head: Normocephalic and atraumatic.  Right Ear: Tympanic membrane and ear canal normal.  Left Ear: Tympanic membrane and ear canal normal.  Mouth/Throat: not examined- pt wearing a mask Eyes: Pupils are equal, round, and reactive to light. No scleral icterus.  Neck: Normal range of motion. No thyromegaly present.  Cardiovascular: Normal rate and regular rhythm.   No murmur heard. Pulmonary/Chest: Effort normal and breath sounds normal. No respiratory distress. He has no wheezes. She has no rales. She exhibits no tenderness.  Abdominal: Soft. Bowel sounds are normal. She exhibits no distension and no mass. There is no tenderness. There is no rebound and no guarding.  Musculoskeletal: She exhibits no edema.  Lymphadenopathy:    She has no cervical adenopathy.  Neurological: She is alert and oriented to person, place, and time. She has normal patellar reflexes. She exhibits normal muscle tone. Coordination normal.  Skin: Skin is warm and dry.  Psychiatric: She has a normal mood and affect. Her behavior is normal. Judgment and thought content normal.  Breasts: Examined lying Right: Without masses, retractions, discharge or axillary adenopathy.  Left: Without masses, retractions, discharge or axillary adenopathy. Surgical scar noted left breast at 6 oclock          Assessment & Plan:   Preventative care- discussed healthy diet and regular exercise. Immunizations reviewed and up to date. Colonoscopy up to date. She will schedule routine dental.  HTN- bp stable on lotensin HCT. Continue same, obtain follow up cmet.  Hyperlipidemia- obtain follow up lipid panel.   This visit occurred during the SARS-CoV-2 public health emergency.  Safety protocols were in place, including screening questions prior to the visit,  additional usage of staff PPE, and extensive cleaning of exam room while observing appropriate contact time as indicated for disinfecting solutions.       Assessment & Plan:

## 2020-01-28 ENCOUNTER — Ambulatory Visit: Payer: BC Managed Care – PPO | Attending: Family

## 2020-01-28 DIAGNOSIS — Z23 Encounter for immunization: Secondary | ICD-10-CM

## 2020-01-28 NOTE — Progress Notes (Signed)
   Covid-19 Vaccination Clinic  Name:  Courtney Keller    MRN: [id number] DOB: [birth year]  01/28/2020  Courtney Keller was observed post Covid-19 immunization for 15 minutes without incident. She was provided with Vaccine Information Sheet and instruction to access the V-Safe system.   Courtney Keller was instructed to call 911 with any severe reactions post vaccine: Marland Kitchen Difficulty breathing  . Swelling of face and throat  . A fast heartbeat  . A bad rash all over body  . Dizziness and weakness   Immunizations Administered    Name Date Dose VIS Date Route   Moderna COVID-19 Vaccine 01/28/2020  1:32 PM 0.5 mL 10/20/2019 Intramuscular   Manufacturer: Moderna   Lot: [id number]   [id number]

## 2020-03-01 ENCOUNTER — Ambulatory Visit: Payer: BC Managed Care – PPO | Attending: Family

## 2020-03-01 DIAGNOSIS — Z23 Encounter for immunization: Secondary | ICD-10-CM

## 2020-03-01 NOTE — Progress Notes (Signed)
   Covid-19 Vaccination Clinic  Name:  FELCIA COONTZ    MRN: [id number] DOB: 1956-09-23  03/01/2020  Ms. Johnson-Taylor was observed post Covid-19 immunization for 15 minutes without incident. She was provided with Vaccine Information Sheet and instruction to access the V-Safe system.   Ms. Werther was instructed to call 911 with any severe reactions post vaccine: Marland Kitchen Difficulty breathing  . Swelling of face and throat  . A fast heartbeat  . A bad rash all over body  . Dizziness and weakness   Immunizations Administered    Name Date Dose VIS Date Route   Moderna COVID-19 Vaccine 03/01/2020 10:30 AM 0.5 mL 10/20/2019 Intramuscular   Manufacturer: Moderna   Lot: [id number]   [place]:[id number]

## 2020-03-18 ENCOUNTER — Encounter: Payer: Self-pay | Admitting: Family

## 2020-03-19 ENCOUNTER — Emergency Department (HOSPITAL_BASED_OUTPATIENT_CLINIC_OR_DEPARTMENT_OTHER): Payer: BC Managed Care – PPO

## 2020-03-19 ENCOUNTER — Encounter (HOSPITAL_BASED_OUTPATIENT_CLINIC_OR_DEPARTMENT_OTHER): Payer: Self-pay | Admitting: Emergency Medicine

## 2020-03-19 ENCOUNTER — Other Ambulatory Visit: Payer: Self-pay

## 2020-03-19 DIAGNOSIS — Z79899 Other long term (current) drug therapy: Secondary | ICD-10-CM | POA: Insufficient documentation

## 2020-03-19 DIAGNOSIS — R079 Chest pain, unspecified: Secondary | ICD-10-CM | POA: Diagnosis present

## 2020-03-19 DIAGNOSIS — I10 Essential (primary) hypertension: Secondary | ICD-10-CM | POA: Diagnosis not present

## 2020-03-19 DIAGNOSIS — R0789 Other chest pain: Secondary | ICD-10-CM | POA: Diagnosis not present

## 2020-03-19 LAB — BASIC METABOLIC PANEL
Anion gap: 10 (ref 5–15)
BUN: 29 mg/dL — ABNORMAL HIGH (ref 8–23)
CO2: 28 mmol/L (ref 22–32)
Calcium: 10 mg/dL (ref 8.9–10.3)
Chloride: 100 mmol/L (ref 98–111)
Creatinine, Ser: 0.95 mg/dL (ref 0.44–1.00)
GFR calc Af Amer: >60 mL/min (ref >60)
GFR calc non Af Amer: >60 mL/min (ref >60)
Glucose, Bld: 112 mg/dL — ABNORMAL HIGH (ref 70–99)
Potassium: 3.2 mmol/L — ABNORMAL LOW (ref 3.5–5.1)
Sodium: 138 mmol/L (ref 135–145)

## 2020-03-19 LAB — CBC
HCT: 39 % (ref 36.0–46.0)
Hemoglobin: 12.8 g/dL (ref 12.0–15.0)
MCH: 30.4 pg (ref 26.0–34.0)
MCHC: 32.8 g/dL (ref 30.0–36.0)
MCV: 92.6 fL (ref 80.0–100.0)
Platelets: 255 10*3/uL (ref 150–400)
RBC: 4.21 MIL/uL (ref 3.87–5.11)
RDW: 12.9 % (ref 11.5–15.5)
WBC: 8.4 10*3/uL (ref 4.0–10.5)
nRBC: 0 % (ref 0.0–0.2)

## 2020-03-19 LAB — TROPONIN I (HIGH SENSITIVITY): Troponin I (High Sensitivity): 8 ng/L (ref <18)

## 2020-03-19 MED ORDER — SODIUM CHLORIDE 0.9% FLUSH
3.0000 mL | Freq: Once | INTRAVENOUS | Status: DC
  Filled 2020-03-19: qty 3

## 2020-03-19 NOTE — ED Triage Notes (Signed)
Reports central chest pressure that started yesterday.  Left a message with PCP.  Was told to come to the ER.  Denies any cardiac history.

## 2020-03-20 ENCOUNTER — Emergency Department (HOSPITAL_BASED_OUTPATIENT_CLINIC_OR_DEPARTMENT_OTHER)
Admission: EM | Admit: 2020-03-20 | Discharge: 2020-03-20 | Disposition: A | Discharge status: Home / Self Care | Payer: BC Managed Care – PPO | Attending: Emergency Medicine | Admitting: Emergency Medicine

## 2020-03-20 DIAGNOSIS — R0789 Other chest pain: Secondary | ICD-10-CM

## 2020-03-20 LAB — TROPONIN I (HIGH SENSITIVITY): Troponin I (High Sensitivity): 9 ng/L (ref <18)

## 2020-03-20 NOTE — ED Provider Notes (Signed)
[facility] EMERGENCY DEPARTMENT Provider Note   CSN: [id number] Arrival date & time: 03/19/20  2208     History Chief Complaint  Patient presents with  . Chest Pain    Courtney Keller is a 64 y.o. female.  Patient presents to the emergency department for evaluation of chest discomfort.  Patient reports that she has been having intermittent episodes of a tightness in the center of her chest.  She has felt this before but yesterday it came on and lasted longer than usual.  She called her doctor and was told to go to urgent care, but by the time she heard back from her doctor the pain had resolved and she did not.  Symptoms reoccurred today so she presents for evaluation.  No associated shortness of breath, nausea, diaphoresis.  Pain is not related to exertion.  She was able to do all of her chores today and move around without any discomfort.  She started to have tightness in her chest again tonight after she was at home resting.  She does have a history of controlled hypertension.  No diabetes, high cholesterol, smoking, family history of heart disease.        Past Medical History:  Diagnosis Date  . 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 Breast cancer ([place])   . Cancer ([place])   . Hypertension     Patient Active Problem List   Diagnosis Date Noted  . 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 Osteopenia 08/21/2018  . Preventative health care 09/20/2014  . HTN (hypertension) 09/04/2013  . Breast cancer ([place]) 10/27/2012    Past Surgical History:  Procedure Laterality Date  . ABDOMINAL HYSTERECTOMY  2006  . BREAST LUMPECTOMY Left 2006  . COLONOSCOPY     colon 10 + years ago normal per pt. cannot remember where it was done  . MYOMECTOMY       OB History   No obstetric history on file.     Family History  Problem Relation Age of Onset  . Hypertension Mother   . Cancer Mother 51       breast cancer  . Breast cancer Mother   .  Hypertension Maternal Grandmother   . Colon cancer Neg Hx   . Rectal cancer Neg Hx   . Stomach cancer Neg Hx     Social History   Tobacco Use  . Smoking status: Never Smoker  . Smokeless tobacco: Never Used  Substance Use Topics  . Alcohol use: Yes    Alcohol/week: 5.0 standard drinks    Types: 5 Glasses of wine per week    Comment: 5 glasses of wine a week   . Drug use: No    Home Medications Prior to Admission medications   Medication Sig Start Date End Date Taking? Authorizing Provider  benazepril-hydrochlorthiazide (LOTENSIN HCT) 20-12.5 MG tablet TAKE 1 TABLET BY MOUTH EVERY DAY 11/05/19   Debbrah Alar, NP  calcium carbonate (OS-CAL) 600 MG TABS Take 600 mg by mouth 2 (two) times daily with a meal.      [provider]  meloxicam (MOBIC) 7.5 MG tablet Take 1 tablet (7.5 mg total) by mouth daily. 07/03/19   Debbrah Alar, NP  potassium chloride (KLOR-CON M10) 10 MEQ tablet TAKE 2 TABLETS BY MOUTH EVERY DAY 11/03/19   Debbrah Alar, NP    Allergies    Patient has no known allergies.  Review of Systems   Review of Systems  Respiratory: Positive for chest  tightness.   All other systems reviewed and are negative.   Physical Exam Updated Vital Signs BP (!) 146/92   Pulse 96   Temp 98.1 F (36.7 C) (Oral)   Resp 17   Ht 5\' 3"  (1.6 m)   Wt 63 kg   SpO2 100%   BMI 24.62 kg/m   Physical Exam Vitals and nursing note reviewed.  Constitutional:      General: She is not in acute distress.    Appearance: Normal appearance. She is well-developed.  HENT:     Head: Normocephalic and atraumatic.     Right Ear: Hearing normal.     Left Ear: Hearing normal.     Nose: Nose normal.  Eyes:     Conjunctiva/sclera: Conjunctivae normal.     Pupils: Pupils are equal, round, and reactive to light.  Cardiovascular:     Rate and Rhythm: Regular rhythm.     Heart sounds: S1 normal and S2 normal. No murmur. No friction rub. No gallop.   Pulmonary:      Effort: Pulmonary effort is normal. No respiratory distress.     Breath sounds: Normal breath sounds.  Chest:     Chest wall: No tenderness.  Abdominal:     General: Bowel sounds are normal.     Palpations: Abdomen is soft.     Tenderness: There is no abdominal tenderness. There is no guarding or rebound. Negative signs include Murphy's sign and McBurney's sign.     Hernia: No hernia is present.  Musculoskeletal:        General: Normal range of motion.     Cervical back: Normal range of motion and neck supple.  Skin:    General: Skin is warm and dry.     Findings: No rash.  Neurological:     Mental Status: She is alert and oriented to person, place, and time.     GCS: GCS eye subscore is 4. GCS verbal subscore is 5. GCS motor subscore is 6.     Cranial Nerves: No cranial nerve deficit.     Sensory: No sensory deficit.     Coordination: Coordination normal.  Psychiatric:        Speech: Speech normal.        Behavior: Behavior normal.        Thought Content: Thought content normal.     ED Results / Procedures / Treatments   Labs (all labs ordered are listed, but only abnormal results are displayed) Labs Reviewed  BASIC METABOLIC PANEL - Abnormal; Notable for the following components:      Result Value   Potassium 3.2 (*)    Glucose, Bld 112 (*)    BUN 29 (*)    All other components within normal limits  CBC  TROPONIN I (HIGH SENSITIVITY)  TROPONIN I (HIGH SENSITIVITY)    EKG EKG Interpretation  Date/Time:  Saturday Mar 19 2020 22:12:45 EDT Ventricular Rate:  122 PR Interval:  130 QRS Duration: 86 QT Interval:  338 QTC Calculation: 481 R Axis:   14 Text Interpretation: Sinus rhythm with PACs Otherwise normal ECG Confirmed by Orpah Greek [phone]) on 03/20/2020 12:30:16 AM   Radiology DG Chest 2 View  Result Date: 03/19/2020 CLINICAL DATA:  Chest pain EXAM: CHEST - 2 VIEW COMPARISON:  12/31/2003 FINDINGS: The heart size and mediastinal contours are within  normal limits. Both lungs are clear. The visualized skeletal structures are unremarkable. Gas beneath the left hemidiaphragm is likely gastric or colonic. IMPRESSION: No  active cardiopulmonary disease. Electronically Signed   By: Ulyses Jarred M.D.   On: 03/19/2020 23:01    Procedures Procedures (including critical care time)  Medications Ordered in ED Medications  sodium chloride flush (NS) 0.9 % injection 3 mL (has no administration in time range)    ED Course  I have reviewed the triage vital signs and the nursing notes.  Pertinent labs & imaging results that were available during my care of the patient were reviewed by me and considered in my medical decision making (see chart for details).    MDM Rules/Calculators/A&P                      Patient presents to the emergency department for evaluation of a pressure and tightness in the center of her chest that has been intermittent.  She does not have associated nausea, vomiting, diaphoresis.  She does not have shortness of breath.  Patient was tachycardic at arrival but this resolved without intervention.  She reports that she has been under a lot of stress recently.  Symptoms are atypical.  She does not have exertional chest pain and is fairly active.  Her only cardiac risk factor is hypertension which is fairly well controlled generally.  EKG shows PACs but no ischemic changes.  Troponin negative x2.  Patient is felt to be very low risk for cardiac etiology, can follow-up as an outpatient.  She was mildly tachycardic at arrival but I believe this was stress and anxiety.  It has resolved and she has not had any pulmonary symptoms.  Do not believe she has any risk factors or concern for PE at this time.  Final Clinical Impression(s) / ED Diagnoses Final diagnoses:  Atypical chest pain    Rx / DC Orders ED Discharge Orders    None       Moriyah Byington, Gwenyth Allegra, MD 03/20/20 [phone]

## 2020-06-03 ENCOUNTER — Ambulatory Visit: Payer: BC Managed Care – PPO | Admitting: Family

## 2020-06-03 DIAGNOSIS — Z0289 Encounter for other administrative examinations: Secondary | ICD-10-CM

## 2020-06-17 ENCOUNTER — Other Ambulatory Visit: Payer: Self-pay | Admitting: Family

## 2020-06-17 NOTE — Telephone Encounter (Signed)
Please contact pt to schedule a follow up visit.  

## 2020-07-06 ENCOUNTER — Other Ambulatory Visit: Payer: Self-pay

## 2020-07-06 ENCOUNTER — Ambulatory Visit: Payer: BC Managed Care – PPO | Admitting: Family

## 2020-07-06 VITALS — BP 145/87 | HR 86 | Temp 98.9°F | Resp 16 | Ht 63.5 in | Wt 140.0 lb

## 2020-07-06 DIAGNOSIS — Z0000 Encounter for general adult medical examination without abnormal findings: Secondary | ICD-10-CM

## 2020-07-06 DIAGNOSIS — I10 Essential (primary) hypertension: Secondary | ICD-10-CM | POA: Diagnosis not present

## 2020-07-06 DIAGNOSIS — E876 Hypokalemia: Secondary | ICD-10-CM

## 2020-07-06 LAB — BASIC METABOLIC PANEL
BUN: 14 mg/dL (ref 6–23)
CO2: 30 mEq/L (ref 19–32)
Calcium: 10.1 mg/dL (ref 8.4–10.5)
Chloride: 99 mEq/L (ref 96–112)
Creatinine, Ser: 0.78 mg/dL (ref 0.40–1.20)
GFR: 89.76 mL/min (ref >60.00)
Glucose, Bld: 89 mg/dL (ref 70–99)
Potassium: 4.2 mEq/L (ref 3.5–5.1)
Sodium: 139 mEq/L (ref 135–145)

## 2020-07-06 MED ORDER — BENAZEPRIL-HYDROCHLOROTHIAZIDE 20-12.5 MG PO TABS: 1.0000 | ORAL_TABLET | Freq: Every day | ORAL | 1 refills | Status: DC

## 2020-07-06 MED ORDER — POTASSIUM CHLORIDE CRYS ER 10 MEQ PO TBCR: EXTENDED_RELEASE_TABLET | ORAL | 1 refills | Status: DC

## 2020-07-06 NOTE — Progress Notes (Signed)
Subjective:    Patient ID: Courtney Keller, female    DOB: 06-14-1956, 64 y.o.   MRN: [id number]  HPI  Patient is a 64 yr old female who presents today for follow up.  HTN- current medications include lotensin HCT.  Denies CP/SOB/Swelling BP Readings from Last 3 Encounters:  07/06/20 (!) 145/87  03/20/20 108/67  12/04/19 129/73      Review of Systems See HPI  Past Medical History:  Diagnosis Date  . 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 Breast cancer ([place])   . Cancer ([place])   . Hypertension      Social History   Socioeconomic History  . Marital status: Married    Spouse name: Not on file  . Number of children: Not on file  . Years of education: Not on file  . Highest education level: Not on file  Occupational History  . Not on file  Tobacco Use  . Smoking status: Never Smoker  . Smokeless tobacco: Never Used  Substance and Sexual Activity  . Alcohol use: Yes    Alcohol/week: 5.0 standard drinks    Types: 5 Glasses of wine per week    Comment: 5 glasses of wine a week   . Drug use: No  . Sexual activity: Not on file  Other Topics Concern  . Not on file  Social History Narrative   1 daughter- [name]- born 55.   Married   Works at [employer] and T, runs Armed forces technical officer degree   Enjoys puzzles         Social Determinants of Radio broadcast assistant Strain:   . Difficulty of Paying Living Expenses:   Food Insecurity:   . Worried About Charity fundraiser in the Last Year:   . Arboriculturist in the Last Year:   Transportation Needs:   . Film/video editor (Medical):   Marland Kitchen Lack of Transportation (Non-Medical):   Physical Activity:   . Days of Exercise per Week:   . Minutes of Exercise per Session:   Stress:   . Feeling of Stress :   Social Connections:   . Frequency of Communication with Friends and Family:   . Frequency of Social Gatherings with Friends and Family:   . Attends  Religious Services:   . Active Member of Clubs or Organizations:   . Attends Archivist Meetings:   Marland Kitchen Marital Status:   Intimate Partner Violence:   . Fear of Current or Ex-Partner:   . Emotionally Abused:   Marland Kitchen Physically Abused:   . Sexually Abused:     Past Surgical History:  Procedure Laterality Date  . ABDOMINAL HYSTERECTOMY  2006  . BREAST LUMPECTOMY Left 2006  . COLONOSCOPY     colon 10 + years ago normal per pt. cannot remember where it was done  . MYOMECTOMY      Family History  Problem Relation Age of Onset  . Hypertension Mother   . Cancer Mother 66       breast cancer  . Breast cancer Mother   . Hypertension Maternal Grandmother   . Colon cancer Neg Hx   . Rectal cancer Neg Hx   . Stomach cancer Neg Hx     No Known Allergies  Current Outpatient Medications on File Prior to Visit  Medication Sig Dispense Refill  . benazepril-hydrochlorthiazide (LOTENSIN HCT) 20-12.5 MG tablet TAKE 1 TABLET BY MOUTH EVERY DAY 90 tablet  0  . calcium carbonate (OS-CAL) 600 MG TABS Take 600 mg by mouth 2 (two) times daily with a meal.      . potassium chloride (KLOR-CON M10) 10 MEQ tablet TAKE 2 TABLETS BY MOUTH EVERY DAY 180 tablet 1   No current facility-administered medications on file prior to visit.    BP (!) 145/87 (BP Location: Right Arm, Patient Position: Sitting, Cuff Size: Small)   Pulse 86   Temp 98.9 F (37.2 C) (Oral)   Resp 16   Ht 5' 3.5" (1.613 m)   Wt 140 lb (63.5 kg)   SpO2 100%   BMI 24.41 kg/m       Objective:   Physical Exam Constitutional:      Appearance: She is well-developed.  Cardiovascular:     Rate and Rhythm: Normal rate and regular rhythm.     Heart sounds: Normal heart sounds. No murmur heard.   Pulmonary:     Effort: Pulmonary effort is normal. No respiratory distress.     Breath sounds: Normal breath sounds. No wheezing.  Psychiatric:        Behavior: Behavior normal.        Thought Content: Thought content normal.         Judgment: Judgment normal.           Assessment & Plan:  HTN- initial BP was mildly elevated. Follow up manual check 125/84.   Continue current dose of lotensin-HCT.  Hypokalemia- obtain follow up bmet. Continue kdur.   This visit occurred during the SARS-CoV-2 public health emergency.  Safety protocols were in place, including screening questions prior to the visit, additional usage of staff PPE, and extensive cleaning of exam room while observing appropriate contact time as indicated for disinfecting solutions.

## 2020-07-06 NOTE — Patient Instructions (Signed)
Please complete lab work prior to leaving.   

## 2020-10-10 ENCOUNTER — Ambulatory Visit: Payer: BC Managed Care – PPO | Admitting: Family

## 2020-10-10 DIAGNOSIS — Z0289 Encounter for other administrative examinations: Secondary | ICD-10-CM

## 2020-10-24 ENCOUNTER — Ambulatory Visit: Payer: BC Managed Care – PPO | Attending: Internal Medicine

## 2020-10-24 DIAGNOSIS — Z23 Encounter for immunization: Secondary | ICD-10-CM

## 2020-10-24 NOTE — Progress Notes (Signed)
   Covid-19 Vaccination Clinic  Name:  Courtney Keller    MRN: [id number] DOB: September 03, 1956  10/24/2020  Courtney Keller was observed post Covid-19 immunization for 15 minutes without incident. She was provided with Vaccine Information Sheet and instruction to access the V-Safe system.   Courtney Keller was instructed to call 911 with any severe reactions post vaccine: Marland Kitchen Difficulty breathing  . Swelling of face and throat  . A fast heartbeat  . A bad rash all over body  . Dizziness and weakness   Immunizations Administered    No immunizations on file.

## 2021-01-26 ENCOUNTER — Telehealth: Payer: Self-pay | Admitting: Family

## 2021-01-26 NOTE — Telephone Encounter (Signed)
See mychart.  

## 2021-03-31 ENCOUNTER — Other Ambulatory Visit: Payer: Self-pay | Admitting: Family

## 2021-03-31 DIAGNOSIS — Z1231 Encounter for screening mammogram for malignant neoplasm of breast: Secondary | ICD-10-CM

## 2021-04-21 IMAGING — CR DG CHEST 2V
2 series · 2 of 2 positions shown · non-contrast
Comparison: 12/31/2003

CLINICAL DATA: Chest pain

EXAM:
CHEST - 2 VIEW

[w chest pa]
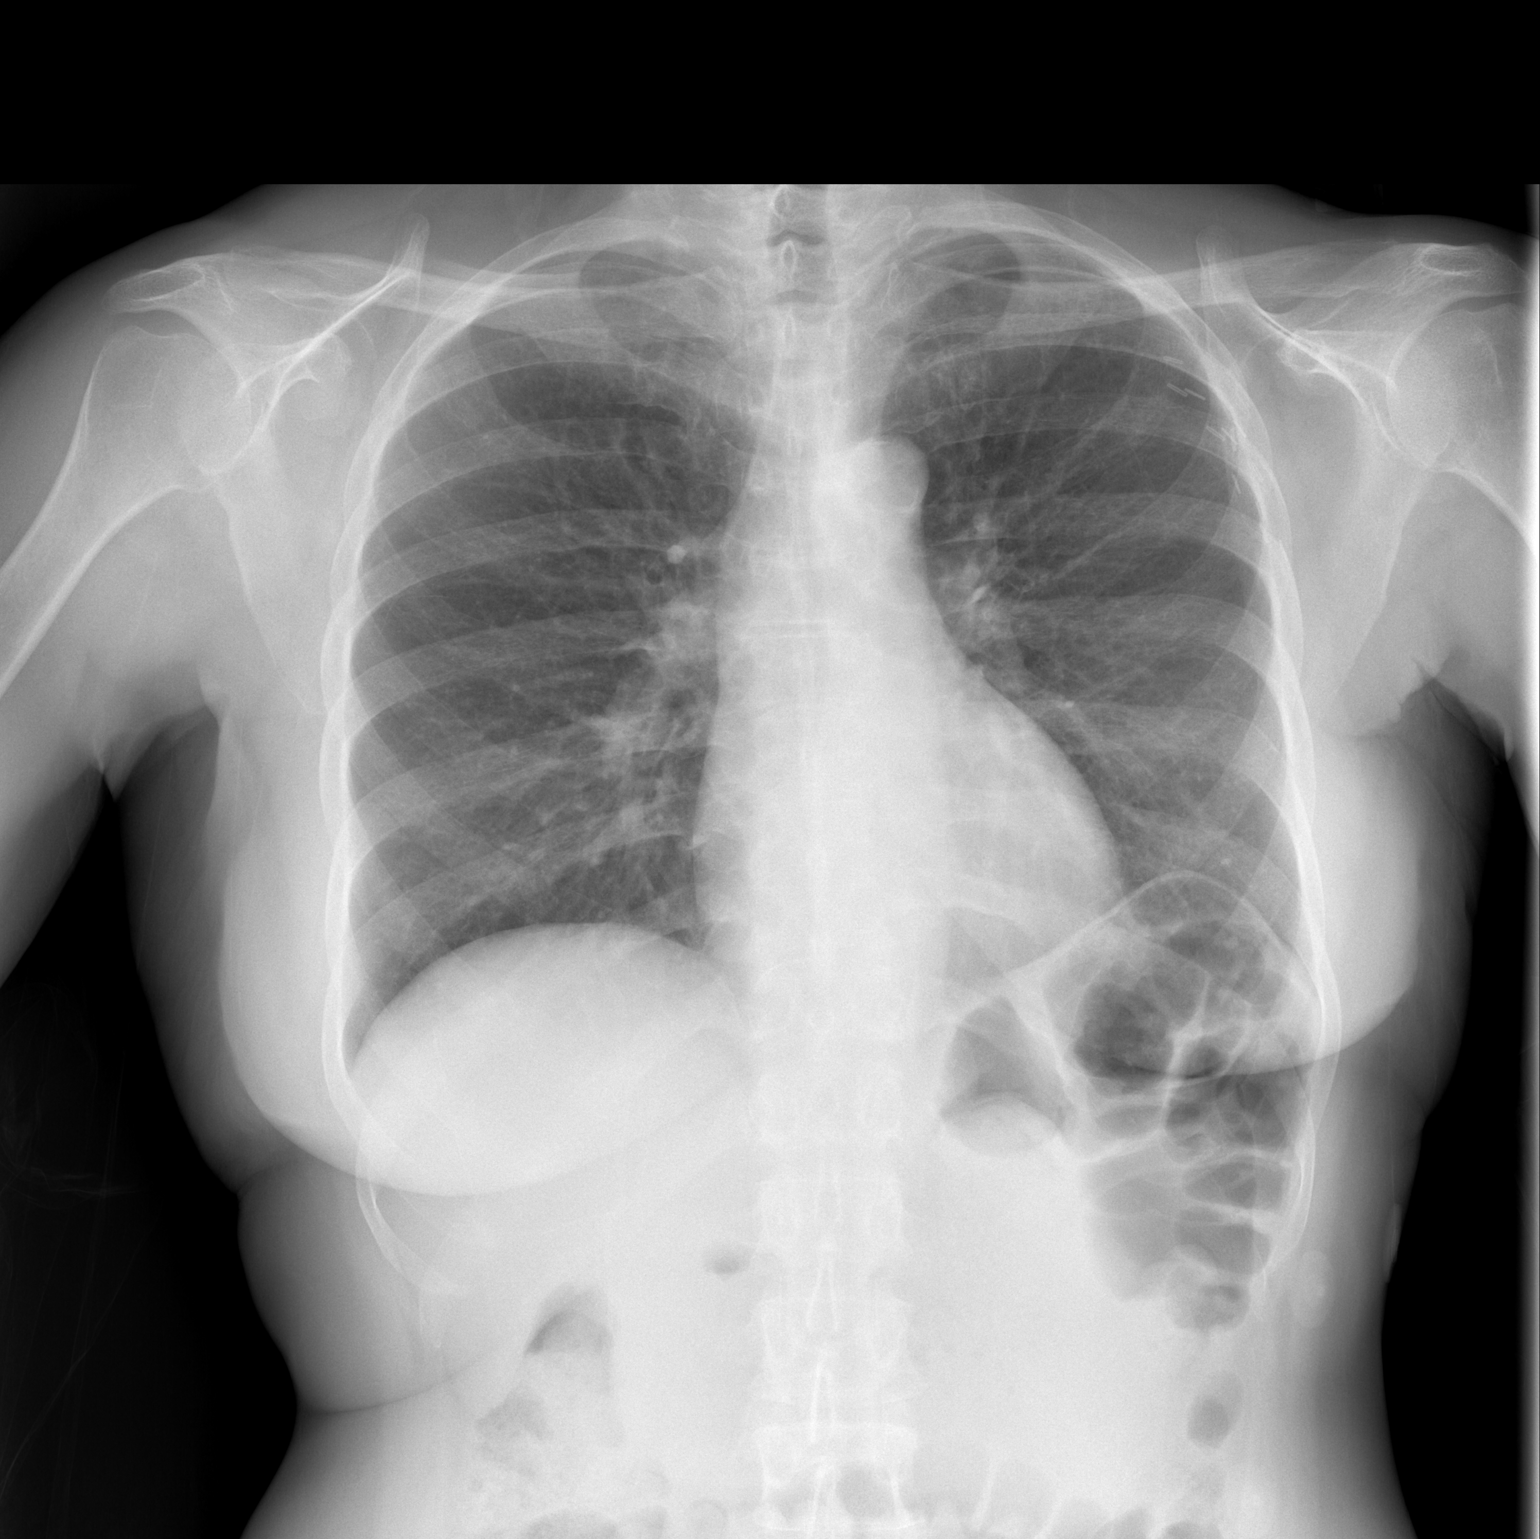

[w chest lat]
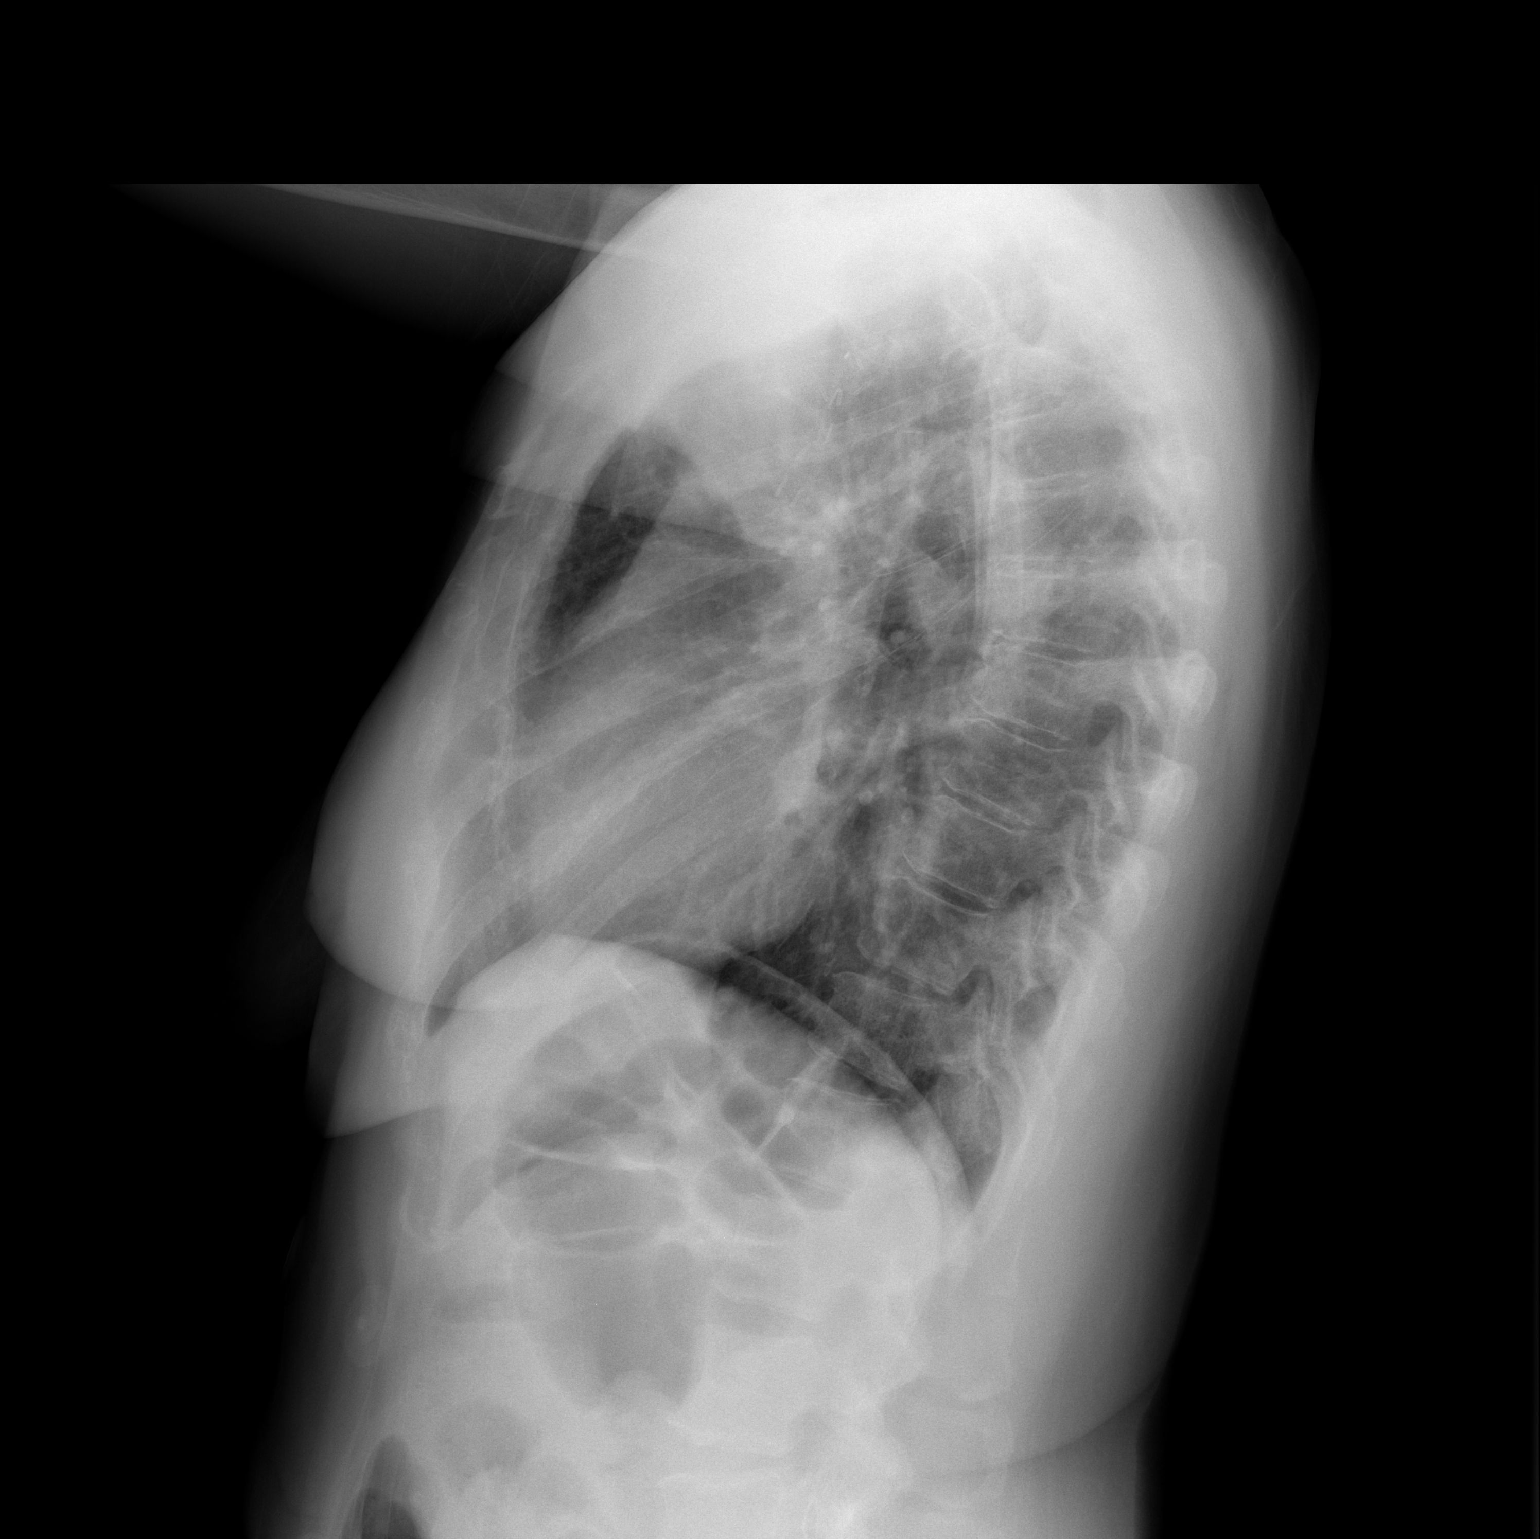

[2 of 2 positions shown; findings below may reference images not displayed]

FINDINGS: The heart size and mediastinal contours are within normal limits.
Both lungs are clear. The visualized skeletal structures are
unremarkable. Gas beneath the left hemidiaphragm is likely gastric
or colonic.
IMPRESSION: No active cardiopulmonary disease.

## 2021-05-08 ENCOUNTER — Other Ambulatory Visit: Payer: Self-pay | Admitting: Family

## 2021-05-08 NOTE — Telephone Encounter (Signed)
rx sent

## 2021-05-12 ENCOUNTER — Encounter: Payer: Self-pay | Admitting: Family

## 2021-05-12 ENCOUNTER — Ambulatory Visit (INDEPENDENT_AMBULATORY_CARE_PROVIDER_SITE_OTHER): Payer: BC Managed Care – PPO | Admitting: Family

## 2021-05-12 ENCOUNTER — Other Ambulatory Visit: Payer: Self-pay

## 2021-05-12 VITALS — BP 153/82 | HR 83 | Temp 98.7°F | Resp 16 | Ht 63.5 in | Wt 138.0 lb

## 2021-05-12 DIAGNOSIS — E785 Hyperlipidemia, unspecified: Secondary | ICD-10-CM

## 2021-05-12 DIAGNOSIS — Z0000 Encounter for general adult medical examination without abnormal findings: Secondary | ICD-10-CM

## 2021-05-12 DIAGNOSIS — R739 Hyperglycemia, unspecified: Secondary | ICD-10-CM

## 2021-05-12 DIAGNOSIS — I10 Essential (primary) hypertension: Secondary | ICD-10-CM | POA: Diagnosis not present

## 2021-05-12 LAB — COMPREHENSIVE METABOLIC PANEL
AG Ratio: 1.5 (calc) (ref 1.0–2.5)
ALT: 11 U/L (ref 6–29)
AST: 26 U/L (ref 10–35)
Albumin: 4.6 g/dL (ref 3.6–5.1)
Alkaline phosphatase (APISO): 51 U/L (ref 37–153)
BUN: 15 mg/dL (ref 7–25)
CO2: 27 mmol/L (ref 20–32)
Calcium: 10 mg/dL (ref 8.6–10.4)
Chloride: 101 mmol/L (ref 98–110)
Creat: 0.87 mg/dL (ref 0.50–0.99)
Globulin: 3.1 g/dL (calc) (ref 1.9–3.7)
Glucose, Bld: 83 mg/dL (ref 65–99)
Potassium: 4 mmol/L (ref 3.5–5.3)
Sodium: 139 mmol/L (ref 135–146)
Total Bilirubin: 0.8 mg/dL (ref 0.2–1.2)
Total Protein: 7.7 g/dL (ref 6.1–8.1)

## 2021-05-12 LAB — LIPID PANEL
Cholesterol: 247 mg/dL — ABNORMAL HIGH (ref <200)
HDL: 86 mg/dL (ref >=50)
LDL Cholesterol (Calc): 145 mg/dL (calc) — ABNORMAL HIGH
Non-HDL Cholesterol (Calc): 161 mg/dL (calc) — ABNORMAL HIGH (ref <130)
Total CHOL/HDL Ratio: 2.9 (calc) (ref <5.0)
Triglycerides: 56 mg/dL (ref <150)

## 2021-05-12 NOTE — Progress Notes (Signed)
Subjective:   By signing my name below, I, Shehryar Baig, attest that this documentation has been prepared under the direction and in the presence of Debbrah Alar NP. 05/12/2021     Patient ID: Courtney Keller, female    DOB: 06-03-1956, 65 y.o.   MRN: [id number]  Chief Complaint  Patient presents with   Annual Exam         HPI Patient is in today for a comprehensive physical exam. She denies having any unexpected weight change, hearing loss and rhinorrhea, visual disturbance, cough, chest pain and leg swelling, nausea, vomiting, diarrhea and blood in stool, or dysuria and frequency, for myalgias and arthralgias, rash, headaches, adenopathy, depression or anxiety at this time. She has had no recent changes to her family medical history. She has had no recent changes to her surgical history. She drinks 4-5 glasses of wine weekly. She does not use drugs. She does not smoke. She is sexually active at this time. She continues working at [employer].   Mood- She reports her mood is doing well at this time. Hypertension- She continues taking 20-12.5 mg benazepril-hydrochlorothiazide daily PO. BP Readings from Last 3 Encounters:  05/12/21 (!) 153/82  07/06/20 (!) 145/87  03/20/20 108/67   Blood sugar- Her blood sugar is slightly elevated during her last screening. She does not remember if she has a family history of diabetes. Immunizations: She has 3 moderna Covid-19 vaccines a this time. She is UTD on shingles vaccines. She is UTD on tetanus vaccines. She is willing to get the flu vaccine this fall.  Diet: She is managing a healthy diet. Exercise: She does not participate in normal exercise. She typically stays indoors. Colonoscopy: Last completed 02/17/2015. Results showed mild diverticulosis in sigmoid colon, internal hemorrhoids, otherwise results are normal. Repeat in 10 years. Dexa: Last completed 08/20/2018. Results showed osteopenia.  Mammogram: Last completed  08/20/2018. Results showed possible mass warranting further evaluation in right breast. Otherwise results normal. She has a upcoming mammogram on 05/26/2021.  Dental: She is due for a dental care and will make an appointment later this month. Vision: She has an upcoming appointment for vision care.   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 cannot remember where it was done   MYOMECTOMY      Family History  Problem Relation Age of Onset   Hypertension Mother    Cancer Mother 38       breast cancer   Breast cancer Mother    Hypertension Maternal Grandmother    Colon cancer Neg Hx    Rectal cancer Neg Hx    Stomach cancer Neg Hx     Social History   Socioeconomic History   Marital status: Married    Spouse name: Not on file   Number of children: Not on file   Years of education: Not on file   Highest education level: Not on file  Occupational History   Not on file  Tobacco Use   Smoking status: Never   Smokeless tobacco: Never  Substance and Sexual Activity   Alcohol use: Yes    Alcohol/week: 5.0 standard drinks    Types: 5 Glasses of wine per week    Comment: 5 glasses of wine a week    Drug use: No   Sexual activity: Yes  Other Topics Concern   Not on file  Social History Narrative   1 daughter- [name]- born 30.Married   Works at [employer] and [employer], runs Armed forces technical officer degree   Enjoys puzzles   One dog   Social Determinants of Radio broadcast assistant Strain: Not  on Art therapist Insecurity: Not on file  Transportation Needs: Not on file  Physical Activity: Not on file  Stress: Not on file  Social Connections: Not on file  Intimate Partner Violence: Not on file    Outpatient Medications Prior to Visit  Medication Sig Dispense Refill   benazepril-hydrochlorthiazide (LOTENSIN HCT) 20-12.5 MG tablet TAKE 1 TABLET BY MOUTH EVERY DAY 90 tablet 1   calcium carbonate (OS-CAL) 600 MG TABS Take 600 mg by mouth 2 (two) times daily with a meal.       potassium chloride (KLOR-CON M10) 10 MEQ tablet TAKE 2 TABLETS BY MOUTH EVERY DAY 180 tablet 1   No facility-administered medications prior to visit.    No Known Allergies  Review of Systems  Constitutional:        (-)unexpected weight change (-)Adenopathy  HENT:  Negative for hearing loss.        (-)Rhinorrhea   Eyes:        (-)Visual disturbance  Respiratory:  Negative for cough.   Cardiovascular:  Negative for chest pain and leg swelling.  Gastrointestinal:  Negative for blood in stool, constipation, diarrhea, nausea and vomiting.  Genitourinary:  Negative for dysuria and frequency.  Musculoskeletal:  Negative for joint pain and myalgias.  Skin:  Negative for rash.  Neurological:  Negative for headaches.  Psychiatric/Behavioral:  Negative for depression. The patient is not nervous/anxious.       Objective:    Physical Exam Constitutional:      General: She is not in acute distress.    Appearance: Normal appearance. She is not ill-appearing.  HENT:     Head: Normocephalic and atraumatic.     Right Ear: Tympanic membrane, ear canal and external ear normal.     Left Ear: Tympanic membrane, ear canal and external ear normal.  Eyes:     Extraocular Movements: Extraocular movements intact.     Pupils: Pupils are equal, round, and reactive to light.     Comments: No nystagmus  Cardiovascular:     Rate and Rhythm: Normal rate and regular rhythm.     Pulses: Normal pulses.     Heart sounds:  Normal heart sounds. No murmur heard.   No gallop.     Comments: Her blood pressure measured 158/82 during the PE. Pulmonary:     Effort: Pulmonary effort is normal. No respiratory distress.     Breath sounds: Normal breath sounds. No wheezing, rhonchi or rales.  Abdominal:     General: Bowel sounds are normal. There is no distension.     Palpations: Abdomen is soft. There is no mass.     Tenderness: There is no abdominal tenderness. There is no guarding or rebound.     Hernia: No hernia is  present.  Musculoskeletal:     Comments: 5/5 strength in both upper and lower extremities  Skin:    General: Skin is warm and dry.  Neurological:     Mental Status: She is alert and oriented to person, place, and time.     Deep Tendon Reflexes:     Reflex Scores:      Patellar reflexes are 2+ on the right side and 2+ on the left side. Psychiatric:        Behavior: Behavior normal.    BP (!) 153/82 (BP Location: Right Arm, Patient Position: Sitting, Cuff Size: Small)   Pulse 83   Temp 98.7 F (37.1 C) (Oral)   Resp 16   Ht 5' 3.5" (1.613 m)   Wt 138 lb (62.6 kg)   SpO2 100%   BMI 24.06 kg/m  Wt Readings from Last 3 Encounters:  05/12/21 138 lb (62.6 kg)  07/06/20 140 lb (63.5 kg)  03/19/20 139 lb (63 kg)       Assessment & Plan:   Problem List Items Addressed This Visit       Unprioritized   Preventative health care - Primary    Continues healthy diet, encouraged her to add 30 minutes of exercise 5 days a week. Mammo is scheduled. Colo up to date. Recommended that she get her second covid booster.        HTN (hypertension)    BP elevated in the office today. Reports bp OK at home. Advised pt to check bp once daily for 1 week and send me her readings via mychart. Continue lotensin hct 20-12.5.       Other Visit Diagnoses     Hyperglycemia       Relevant Orders   Comp Met (CMET)   Hyperlipidemia, unspecified hyperlipidemia type       Relevant Orders   Lipid panel         No orders of the defined types were placed in this encounter.   I, Debbrah Alar NP, personally preformed the services described in this documentation.  All medical record entries made by the scribe were at my direction and in my presence.  I have reviewed the chart and discharge instructions (if applicable) and agree that the record reflects my personal performance and is accurate and complete. 05/12/2021   I,Shehryar Baig,acting as a Education administrator for Nance Pear, NP.,have documented all relevant documentation on the behalf of Nance Pear, NP,as directed by  Nance Pear, NP while in the presence of Nance Pear, NP.   Nance Pear, NP

## 2021-05-12 NOTE — Patient Instructions (Signed)
Please complete lab work prior to leaving.  Try to add 30 minutes of exercise 5 days a week.

## 2021-05-12 NOTE — Assessment & Plan Note (Signed)
Continues healthy diet, encouraged her to add 30 minutes of exercise 5 days a week. Mammo is scheduled. Colo up to date. Recommended that she get her second covid booster.

## 2021-05-12 NOTE — Assessment & Plan Note (Signed)
BP elevated in the office today. Reports bp OK at home. Advised pt to check bp once daily for 1 week and send me her readings via mychart. Continue lotensin hct 20-12.5.

## 2021-05-26 ENCOUNTER — Ambulatory Visit
Admission: RE | Admit: 2021-05-26 | Discharge: 2021-05-26 | Disposition: A | Discharge status: Home / Self Care | Payer: BC Managed Care – PPO | Source: Ambulatory Visit | Attending: Family | Admitting: Family

## 2021-05-26 ENCOUNTER — Other Ambulatory Visit: Payer: Self-pay

## 2021-05-26 DIAGNOSIS — Z1231 Encounter for screening mammogram for malignant neoplasm of breast: Secondary | ICD-10-CM

## 2021-06-09 ENCOUNTER — Ambulatory Visit: Payer: BC Managed Care – PPO | Attending: Internal Medicine

## 2021-06-09 DIAGNOSIS — Z23 Encounter for immunization: Secondary | ICD-10-CM

## 2021-06-09 NOTE — Progress Notes (Signed)
   Covid-19 Vaccination Clinic  Name:  Courtney Keller    MRN: [id number] DOB: 1955/12/25  06/09/2021  Courtney Keller was observed post Covid-19 immunization for 15 minutes without incident. She was provided with Vaccine Information Sheet and instruction to access the V-Safe system.   Courtney Keller was instructed to call 911 with any severe reactions post vaccine: Difficulty breathing  Swelling of face and throat  A fast heartbeat  A bad rash all over body  Dizziness and weakness   Immunizations Administered     Name Date Dose VIS Date Route   Moderna Covid-19 Booster Vaccine 06/09/2021  2:41 PM 0.25 mL 09/07/2020 Intramuscular   Manufacturer: Moderna   Lot: [id number]   [id number]

## 2021-06-12 ENCOUNTER — Other Ambulatory Visit (HOSPITAL_BASED_OUTPATIENT_CLINIC_OR_DEPARTMENT_OTHER): Payer: Self-pay

## 2021-06-12 MED ORDER — COVID-19 MRNA VACC (MODERNA) 100 MCG/0.5ML IM SUSP
INTRAMUSCULAR | 0 refills | Status: DC
  Filled 2021-06-12: qty 0.25, 1d supply, fill #0

## 2021-06-19 ENCOUNTER — Telehealth: Payer: Self-pay

## 2021-06-19 NOTE — Telephone Encounter (Signed)
Patient was scheduled to see [facility] for mole evaluation and possible removal

## 2021-06-19 NOTE — Telephone Encounter (Signed)
Pt called in wanted a referral dermatology for raised mole.

## 2021-06-28 ENCOUNTER — Ambulatory Visit (INDEPENDENT_AMBULATORY_CARE_PROVIDER_SITE_OTHER): Payer: BC Managed Care – PPO | Admitting: Family

## 2021-06-28 ENCOUNTER — Other Ambulatory Visit: Payer: Self-pay

## 2021-06-28 ENCOUNTER — Ambulatory Visit: Payer: BC Managed Care – PPO | Admitting: Family

## 2021-06-28 VITALS — BP 130/92 | HR 88 | Temp 98.4°F | Resp 16 | Wt 136.0 lb

## 2021-06-28 DIAGNOSIS — L820 Inflamed seborrheic keratosis: Secondary | ICD-10-CM | POA: Diagnosis not present

## 2021-06-28 DIAGNOSIS — L821 Other seborrheic keratosis: Secondary | ICD-10-CM | POA: Insufficient documentation

## 2021-06-28 NOTE — Assessment & Plan Note (Signed)
Procedure including risks/benefits explained to patient.  Questions were answered. After informed consent was obtained and a time out completed, site was cleansed with betadine and then alcohol. 1% Lidocaine with epinephrine was injected under lesion and then shave removal was performed. Area was cauterized to obtain hemostasis.  Pt tolerated procedure well.  Specimen sent for pathology review.  Pt instructed to keep the area dry for 24 hours and to contact us if she develops redness, drainage or swelling at the site.  Pt may use tylenol as needed for discomfort today.

## 2021-06-28 NOTE — Progress Notes (Signed)
Subjective:   By signing my name below, I, Zite Okoli, attest that this documentation has been prepared under the direction and in the presence of Debbrah Alar, NP 06/28/2021   Patient ID: Courtney Keller, female    DOB: [birth year], 65 y.o.   MRN: [id number]  Chief Complaint  Patient presents with   Nevus    Here for mole evaluation    HPI Patient is in today for an office visit.  Skin- She has raised skin lesion on the center of her chest that she is worried about. She mentions she has had it for a while and would like to have it removed because it has become very irritated and it rubs on her bra.   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 cannot remember where it was done   MYOMECTOMY      Family History  Problem Relation Age of Onset   Hypertension Mother    Cancer Mother 76       breast cancer   Breast cancer Mother    Hypertension Maternal Grandmother    Colon cancer Neg Hx    Rectal cancer Neg Hx    Stomach cancer Neg Hx     Social History   Socioeconomic History   Marital status: Married    Spouse name: Not on file   Number of children: Not on file   Years of education: Not on file   Highest education level: Not on file  Occupational History   Not on file  Tobacco Use   Smoking status: Never   Smokeless tobacco: Never  Substance and Sexual Activity   Alcohol use: Yes    Alcohol/week: 5.0 standard drinks    Types: 5 Glasses of wine per week    Comment: 5 glasses of wine a week    Drug use: No   Sexual activity: Yes  Other Topics Concern   Not on file  Social History Narrative   1 daughter- [name]- born 72.Married   Works at [employer], runs Education officer, museum degree   Enjoys puzzles   One dog   Social Determinants of Radio broadcast assistant Strain: Not on [employer] Insecurity: Not on file  Transportation Needs: Not on file  Physical Activity: Not on file  Stress: Not on file  Social Connections: Not on file  Intimate Partner Violence: Not on file    Outpatient Medications Prior to Visit  Medication Sig Dispense Refill   benazepril-hydrochlorthiazide (LOTENSIN HCT) 20-12.5 MG tablet TAKE 1 TABLET BY MOUTH EVERY DAY 90 tablet 1   calcium carbonate (OS-CAL) 600 MG TABS Take 600 mg by mouth 2 (two) times daily with a meal.       COVID-19 mRNA vaccine, Moderna, 100 MCG/0.5ML injection Inject into the muscle. 0.25 mL 0   potassium chloride (KLOR-CON M10) 10 MEQ tablet TAKE 2 TABLETS BY MOUTH EVERY DAY 180 tablet 1   No facility-administered medications prior to visit.    No Known Allergies  Review of Systems  Skin:        (+) raised seborrheic dermatitis  Objective:    Physical Exam Constitutional:      General: She is not in acute distress.    Appearance: Normal appearance. She is not ill-appearing.  HENT:     Head: Normocephalic and atraumatic.     Right Ear: External ear normal.     Left Ear: External ear normal.  Eyes:     Extraocular Movements: Extraocular movements intact.     Pupils: Pupils are equal, round, and reactive to light.  Pulmonary:     Effort: Pulmonary effort is normal.  Musculoskeletal:     Cervical back: Neck supple.  Lymphadenopathy:     Cervical: No cervical adenopathy.  Skin:    General: Skin is warm and dry.     Comments: raised seborrheic keratosis on chest 1cm wide  Neurological:     Mental Status: She is alert and oriented to person, place, and time.  Psychiatric:        Behavior: Behavior normal.        Judgment: Judgment normal.      BP (!) 130/92 (BP Location: Right Arm, Patient Position: Sitting, Cuff Size: Small)   Pulse 88    Temp 98.4 F (36.9 C) (Oral)   Resp 16   Wt 136 lb (61.7 kg)   SpO2 100%   BMI 23.71 kg/m  Wt Readings from Last 3 Encounters:  06/28/21 136 lb (61.7 kg)  05/12/21 138 lb (62.6 kg)  07/06/20 140 lb (63.5 kg)       Assessment & Plan:   Problem List Items Addressed This Visit       Unprioritized   Seborrheic keratosis - Primary    Procedure including risks/benefits explained to patient.  Questions were answered. After informed consent was obtained and a time out completed, site was cleansed with betadine and then alcohol. 1% Lidocaine with epinephrine was injected under lesion and then shave removal was performed. Area was cauterized to obtain hemostasis.  Pt tolerated procedure well.  Specimen sent for pathology review.  Pt instructed to keep the area dry for 24 hours and to contact us if she develops redness, drainage or swelling at the site.  Pt may use tylenol as needed for discomfort today.          No orders of the defined types were placed in this encounter.   I,Zite Okoli,acting as a Education administrator for [employer], NP.,have documented all relevant documentation on the behalf of Nance Pear, NP,as directed by  Nance Pear, NP while in the presence of Nance Pear, NP.   I, Nance Pear, NP, personally preformed the services described in this documentation.  All medical record entries made by the scribe were at my direction and in my presence.  I have reviewed the chart and discharge instructions (if applicable) and agree that the record reflects my personal performance and is accurate and complete. 06/28/2021

## 2021-06-28 NOTE — Addendum Note (Signed)
Addended by: Jiles Prows on: 06/28/2021 10:12 AM   Modules accepted: Orders

## 2021-06-28 NOTE — Patient Instructions (Signed)
Mole Excision Mole excision is a procedure to remove (excise) a mole from your skin. Most moles are noncancerous (are benign) and do not require treatment. Some moles are larger than usual or look like cancerous moles (atypical moles). You may have a mole excision if: You have an atypical mole that your health care provider thinks should be looked at under a microscope to see if it is cancerous (biopsy). You have a mole that is causing pain. You have a mole that you want removed because you do not like the way it looks. Tell a health care provider about: Any allergies you have. All medicines you are taking, including vitamins, herbs, eye drops, creams, and over-the-counter medicines. Any problems you or family members have had with anesthetic medicines. Any blood disorders you have. Any surgeries you have had. Any medical conditions you have. Whether you are pregnant or may be pregnant. What are the risks? Generally, this is a safe procedure. However, problems may occur, including: Excessive bleeding. Infection. Scarring. Allergic reactions to medicines. Damage to the skin or other tissues around the mole. What happens before the procedure? Ask your health care provider what steps will be taken to help prevent infection. These may include: Removing hair around the mole. Washing skin with germ-killing soap. What happens during the procedure?     You will be given a medicine to numb the area (local anesthetic). Your health care provider will outline the mole with ink and mark the center with a dot. This will serve as a guide during the procedure. Depending on the size of your mole, your health care provider will remove it using: A surgical blade. The mole will be cut out or shaved off (shave excision). A hollow tube with a sharp end (punch device). This may be used for larger moles. Your health care provider may use stitches (sutures) to close the wound in the skin where the mole was  removed (excision site). A bandage (dressing) may be applied over the area. The procedure may vary among health care providers and hospitals. What can I expect after procedure? Your blood pressure, heart rate, breathing rate, and blood oxygen level will be monitored until you leave the hospital or clinic. You may return to your normal activities as told by your health care provider. It is up to you to get any test results. If a sample will be tested in a lab, ask your health care provider or the department that is doing the procedure when your results will be ready. Talk with your health care provider about what your results mean. After the procedure, it is common to have: Mild pain. Your pain may increase as the anesthetic medicine wears off. Mild redness and swelling. Follow these instructions at home: Incision care  Follow instructions from your health care provider about how to take care of your incision. Make sure you: Wash your hands with soap and water before you change your bandage (dressing). If soap and water are not available, use hand sanitizer. Change your dressing as told by your health care provider. Leave stitches (sutures), skin glue, or adhesive strips in place. These skin closures may need to stay in place for 2 weeks or longer. If adhesive strip edges start to loosen and curl up, you may trim the loose edges. Do not remove adhesive strips completely unless your health care provider tells you to do that. Check your incision area every day for signs of infection. Check for: More redness, swelling, or pain. Fluid  or blood. Warmth Pus or a bad smell.  General instructions Take over-the-counter and prescription medicines only as told by your health care provider. Follow instructions from your health care provider about how to minimize scarring. Avoid sun exposure until the area has healed. Scarring should lessen over time. To help prevent scarring, make sure to cover your  wound with sunscreen of at least SPF 30 after the wound has healed and all skin closures have been removed or fallen off. Keep all follow-up visits as told by your health care provider. This is important. Contact a health care provider if: A mole grows back in the same place where a mole had been removed. You have a fever. You have more redness, swelling, or pain at the incision site. You have fluid or blood coming from your incision site. Your incision site feels warm to the touch. You have pus or a bad smell coming from your incision site. Your incision site feels numb for several days after the procedure. Summary Mole excision is a procedure to remove (excise) a mole from your skin. You will be given a medicine to numb the area (local anesthetic) during the procedure to remove the mole. After the procedure, it is common to have mild pain and redness. Wash your hands with soap and water before you change your bandage (dressing). If soap and water are not available, use hand sanitizer. Contact your health care provider if you have problems or questions. This information is not intended to replace advice given to you by your health care provider. Make sure you discuss any questions you have with your healthcare provider. Document Revised: 08/31/2020 Document Reviewed: 08/31/2020 Elsevier Patient Education  2022 [employer].

## 2021-07-25 ENCOUNTER — Other Ambulatory Visit (HOSPITAL_BASED_OUTPATIENT_CLINIC_OR_DEPARTMENT_OTHER): Payer: Self-pay

## 2021-08-14 ENCOUNTER — Other Ambulatory Visit: Payer: Self-pay

## 2021-08-14 ENCOUNTER — Ambulatory Visit (INDEPENDENT_AMBULATORY_CARE_PROVIDER_SITE_OTHER): Payer: BC Managed Care – PPO | Admitting: Family

## 2021-08-14 VITALS — BP 150/81 | HR 80 | Temp 98.2°F | Resp 16 | Ht 63.5 in | Wt 137.0 lb

## 2021-08-14 DIAGNOSIS — Z23 Encounter for immunization: Secondary | ICD-10-CM

## 2021-08-14 DIAGNOSIS — I10 Essential (primary) hypertension: Secondary | ICD-10-CM | POA: Diagnosis not present

## 2021-08-14 LAB — BASIC METABOLIC PANEL
BUN: 22 mg/dL (ref 6–23)
CO2: 29 mEq/L (ref 19–32)
Calcium: 9.7 mg/dL (ref 8.4–10.5)
Chloride: 103 mEq/L (ref 96–112)
Creatinine, Ser: 0.8 mg/dL (ref 0.40–1.20)
GFR: 77.09 mL/min (ref >60.00)
Glucose, Bld: 84 mg/dL (ref 70–99)
Potassium: 4.1 mEq/L (ref 3.5–5.1)
Sodium: 140 mEq/L (ref 135–145)

## 2021-08-14 MED ORDER — POTASSIUM CHLORIDE CRYS ER 10 MEQ PO TBCR: 20.0000 meq | EXTENDED_RELEASE_TABLET | Freq: Every day | ORAL | 0 refills | Status: DC

## 2021-08-14 MED ORDER — BENAZEPRIL-HYDROCHLOROTHIAZIDE 20-25 MG PO TABS
1.0000 | ORAL_TABLET | Freq: Every day | ORAL | 2 refills | Status: DC
Start: 2021-08-14 — End: 2021-08-23

## 2021-08-14 NOTE — Assessment & Plan Note (Addendum)
BP Readings from Last 3 Encounters:  08/14/21 (!) 150/81  06/28/21 (!) 130/92  05/12/21 (!) 153/82   Please change lotensin-hct to 20-25mg . Increase kdur 10 mEQ to two tabs once daily. Complete lab work prior to leaving.

## 2021-08-14 NOTE — Progress Notes (Addendum)
Subjective:   By signing my name below, I, Shehryar Baig, attest that this documentation has been prepared under the direction and in the presence of Debbrah Alar NP. 08/14/2021    Patient ID: Courtney Keller, female    DOB: 11/16/1956, 65 y.o.   MRN: [id number]  Chief Complaint  Patient presents with   Hypertension    Here for follow up    HPI Patient is in today for a office visit.   Blood pressure- Her blood pressure is elevated during this visit. She reports this is due to rushing to the appointment. She does not measure her blood pressure at home. She continues taking 20-12.5 mg benazepril-hydrochlorothiazide daily PO, 20 meq potassium chloride daily PO and reports no new issues while taking it.   BP Readings from Last 3 Encounters:  08/14/21 (!) 150/81  06/28/21 (!) 130/92  05/12/21 (!) 153/82   Pulse Readings from Last 3 Encounters:  08/14/21 80  06/28/21 88  05/12/21 83   Immunizations- She has received a flu vaccine during this visit.    There are no preventive care reminders to display for this patient.   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 cannot remember where it was done   MYOMECTOMY      Family History  Problem Relation Age of Onset   Hypertension Mother    Cancer Mother 75       breast cancer   Breast cancer Mother    Hypertension Maternal Grandmother    Colon cancer Neg Hx    Rectal cancer Neg Hx    Stomach cancer Neg Hx     Social History   Socioeconomic History   Marital status: Married    Spouse name: Not on file   Number of children: Not on file   Years of education: Not on file   Highest education level: Not on file  Occupational History    Not on file  Tobacco Use   Smoking status: Never   Smokeless tobacco: Never  Substance and Sexual Activity   Alcohol use: Yes    Alcohol/week: 5.0 standard drinks    Types: 5 Glasses of wine per week    Comment: 5 glasses of wine a week    Drug use: No   Sexual activity: Yes  Other Topics Concern   Not on file  Social History Narrative   1 daughter- [name]- born 86.Married   Works at [employer], runs Armed forces technical officer degree   Enjoys puzzles   One dog   Social Determinants of Radio broadcast assistant Strain: Not on [employer] Insecurity: Not on file  Transportation Needs: Not on file  Physical Activity: Not on file  Stress: Not on file  Social Connections: Not on file  Intimate Partner Violence: Not on file    Outpatient Medications Prior to Visit  Medication Sig Dispense Refill   calcium carbonate (OS-CAL) 600 MG TABS Take 600 mg by mouth 2 (two) times daily with a meal.       COVID-19 mRNA vaccine, Moderna, 100 MCG/0.5ML injection Inject into the muscle. 0.25  mL 0   benazepril-hydrochlorthiazide (LOTENSIN HCT) 20-12.5 MG tablet TAKE 1 TABLET BY MOUTH EVERY DAY 90 tablet 1   potassium chloride (KLOR-CON M10) 10 MEQ tablet TAKE 2 TABLETS BY MOUTH EVERY DAY 180 tablet 1   No facility-administered medications prior to visit.    No Known Allergies  ROS    See HPI   Objective:    Physical Exam Constitutional:      General: She is not in acute distress.    Appearance: Normal appearance. She is not ill-appearing.  HENT:     Head: Normocephalic and atraumatic.     Right Ear: External ear normal.     Left Ear: External ear normal.  Eyes:     Extraocular Movements: Extraocular movements intact.     Pupils: Pupils are equal, round, and reactive to light.  Cardiovascular:     Rate and Rhythm: Normal rate and regular rhythm.     Heart sounds: Normal heart sounds. No murmur heard.   No gallop.  Pulmonary:     Effort:  Pulmonary effort is normal. No respiratory distress.     Breath sounds: Normal breath sounds. No wheezing or rales.  Skin:    General: Skin is warm and dry.  Neurological:     Mental Status: She is alert and oriented to person, place, and time.  Psychiatric:        Behavior: Behavior normal.    BP (!) 150/81 (BP Location: Right Arm, Patient Position: Sitting, Cuff Size: Small)   Pulse 80   Temp 98.2 F (36.8 C) (Oral)   Resp 16   Ht 5' 3.5" (1.613 m)   Wt 137 lb (62.1 kg)   SpO2 100%   BMI 23.89 kg/m  Wt Readings from Last 3 Encounters:  08/14/21 137 lb (62.1 kg)  06/28/21 136 lb (61.7 kg)  05/12/21 138 lb (62.6 kg)       Assessment & Plan:   Problem List Items Addressed This Visit       Unprioritized   HTN (hypertension)    BP Readings from Last 3 Encounters:  08/14/21 (!) 150/81  06/28/21 (!) 130/92  05/12/21 (!) 153/82  Please change lotensin-hct to 20-25mg . Increase kdur 10 mEQ to two tabs once daily. Complete lab work prior to leaving.       Relevant Medications   benazepril-hydrochlorthiazide (LOTENSIN HCT) 20-25 MG tablet   Other Relevant Orders   Basic metabolic panel   Other Visit Diagnoses     Needs flu shot    -  Primary   Relevant Orders   Flu Vaccine QUAD High Dose(Fluad) (Completed)        Meds ordered this encounter  Medications   benazepril-hydrochlorthiazide (LOTENSIN HCT) 20-25 MG tablet    Sig: Take 1 tablet by mouth daily.    Dispense:  30 tablet    Refill:  2    Order Specific Question:   Supervising Provider    Answer:   Penni Homans A [[id number]]   potassium chloride (KLOR-CON M10) 10 MEQ tablet    Sig: Take 2 tablets (20 mEq total) by mouth daily. TAKE 2 TABLETS BY MOUTH EVERY DAY    Dispense:  180 tablet    Refill:  0    Order Specific Question:   Supervising Provider    Answer:   Penni Homans A [[id number]]    I, Debbrah Alar NP, personally preformed the services described in this documentation.  All medical record  entries made by the scribe were  at my direction and in my presence.  I have reviewed the chart and discharge instructions (if applicable) and agree that the record reflects my personal performance and is accurate and complete. 08/14/2021   I,Shehryar Baig,acting as a scribe for Nance Pear, NP.,have documented all relevant documentation on the behalf of Nance Pear, NP,as directed by  Nance Pear, NP while in the presence of Nance Pear, NP.   Nance Pear, NP

## 2021-08-14 NOTE — Patient Instructions (Signed)
Please change lotensin-hct to 20-25mg . Increase kdur 10 mEQ to two tabs once daily. Complete lab work prior to leaving.

## 2021-08-22 ENCOUNTER — Ambulatory Visit: Payer: BC Managed Care – PPO | Admitting: Family

## 2021-08-23 ENCOUNTER — Other Ambulatory Visit: Payer: Self-pay

## 2021-08-23 ENCOUNTER — Ambulatory Visit (INDEPENDENT_AMBULATORY_CARE_PROVIDER_SITE_OTHER): Payer: BC Managed Care – PPO | Admitting: Family

## 2021-08-23 ENCOUNTER — Encounter: Payer: Self-pay | Admitting: Family

## 2021-08-23 VITALS — BP 130/84 | HR 83 | Temp 98.2°F | Resp 18 | Ht 63.5 in | Wt 134.8 lb

## 2021-08-23 DIAGNOSIS — I10 Essential (primary) hypertension: Secondary | ICD-10-CM

## 2021-08-23 LAB — BASIC METABOLIC PANEL
BUN: 15 mg/dL (ref 6–23)
CO2: 32 mEq/L (ref 19–32)
Calcium: 10 mg/dL (ref 8.4–10.5)
Chloride: 99 mEq/L (ref 96–112)
Creatinine, Ser: 0.77 mg/dL (ref 0.40–1.20)
GFR: 80.69 mL/min (ref >60.00)
Glucose, Bld: 88 mg/dL (ref 70–99)
Potassium: 4 mEq/L (ref 3.5–5.1)
Sodium: 138 mEq/L (ref 135–145)

## 2021-08-23 MED ORDER — POTASSIUM CHLORIDE CRYS ER 10 MEQ PO TBCR: 20.0000 meq | EXTENDED_RELEASE_TABLET | Freq: Every day | ORAL | 0 refills | Status: DC

## 2021-08-23 MED ORDER — BENAZEPRIL-HYDROCHLOROTHIAZIDE 20-25 MG PO TABS: 1.0000 | ORAL_TABLET | Freq: Every day | ORAL | 0 refills | Status: DC

## 2021-08-23 NOTE — Assessment & Plan Note (Signed)
BP is stable/improved on increased dose of lotensin HCT. Will continue same. Obtain bmet to assess K+ and renal function.

## 2021-08-23 NOTE — Progress Notes (Signed)
Subjective:     Patient ID: Courtney Keller, female    DOB: [birth year], 65 y.o.   MRN: [id number]  Chief Complaint  Patient presents with   Hypertension   Follow-up    HPI Patient is in today for htn follow up. Last visit we increased her lotensin-hct from 20-12.5 to 20-25mg .  She reports tolerating this change without difficulty. She has no questions/concerns today.   BP Readings from Last 3 Encounters:  08/23/21 130/84  08/14/21 (!) 150/81  06/28/21 (!) 130/92     There are no preventive care reminders to display for this patient.  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 cannot remember where it was done   MYOMECTOMY      Family History  Problem Relation Age of Onset   Hypertension Mother    Cancer Mother 60       breast cancer   Breast cancer Mother    Hypertension Maternal Grandmother    Colon cancer Neg Hx    Rectal cancer Neg Hx    Stomach cancer Neg Hx     Social History   Socioeconomic History   Marital status: Married    Spouse name: Not on file   Number of children: Not on file   Years of education: Not on file   Highest education level: Not on file  Occupational History   Not on file  Tobacco Use   Smoking status: Never   Smokeless tobacco: Never  Substance and Sexual Activity   Alcohol use: Yes    Alcohol/week: 5.0 standard drinks    Types: 5 Glasses of wine per week    Comment: 5 glasses of wine a week    Drug use: No   Sexual activity: Yes  Other Topics Concern   Not on file  Social History Narrative   1 daughter- [name]- born 35.Married   Works at [employer], runs Armed forces technical officer degree   Enjoys puzzles    One dog   Social Determinants of Radio broadcast assistant Strain: Not on [employer] Insecurity: Not on file  Transportation Needs: Not on file  Physical Activity: Not on file  Stress: Not on file  Social Connections: Not on file  Intimate Partner Violence: Not on file    Outpatient Medications Prior to Visit  Medication Sig Dispense Refill   calcium carbonate (OS-CAL) 600 MG TABS Take 600 mg by mouth 2 (two) times daily with a meal.       benazepril-hydrochlorthiazide (LOTENSIN HCT) 20-25 MG tablet Take 1 tablet by mouth daily. 30 tablet 2   potassium chloride (KLOR-CON M10) 10 MEQ tablet Take 2 tablets (20 mEq total) by mouth daily. TAKE 2 TABLETS BY MOUTH EVERY DAY 180 tablet 0   COVID-19 mRNA vaccine, Moderna, 100 MCG/0.5ML injection Inject into the muscle. (Patient not taking: Reported on 08/23/2021) 0.25 mL 0   No facility-administered medications prior to visit.    No Known Allergies  ROS     Objective:    Physical Exam  BP 130/84 (BP Location: Left Arm, Patient Position: Sitting, Cuff Size: Normal)  Pulse 83   Temp 98.2 F (36.8 C) (Oral)   Resp 18   Ht 5' 3.5" (1.613 m)   Wt 134 lb 12.8 oz (61.1 kg)   SpO2 96%   BMI 23.50 kg/m  Wt Readings from Last 3 Encounters:  08/23/21 134 lb 12.8 oz (61.1 kg)  08/14/21 137 lb (62.1 kg)  06/28/21 136 lb (61.7 kg)       Assessment & Plan:   Problem List Items Addressed This Visit       Unprioritized   HTN (hypertension) - Primary    BP is stable/improved on increased dose of lotensin HCT. Will continue same. Obtain bmet to assess K+ and renal function.       Relevant Medications   benazepril-hydrochlorthiazide (LOTENSIN HCT) 20-25 MG tablet   Other Relevant Orders   Basic metabolic panel    I have discontinued Chandra M. Johnson-Taylor's COVID-19 mRNA vaccine (Moderna). I am also having her maintain her calcium carbonate, benazepril-hydrochlorthiazide, and potassium chloride.  Meds ordered this  encounter  Medications   benazepril-hydrochlorthiazide (LOTENSIN HCT) 20-25 MG tablet    Sig: Take 1 tablet by mouth daily.    Dispense:  90 tablet    Refill:  0    Order Specific Question:   Supervising Provider    Answer:   Penni Homans A [[id number]]   potassium chloride (KLOR-CON M10) 10 MEQ tablet    Sig: Take 2 tablets (20 mEq total) by mouth daily. TAKE 2 TABLETS BY MOUTH EVERY DAY    Dispense:  180 tablet    Refill:  0    Order Specific Question:   Supervising Provider    Answer:   Penni Homans A [[id number]]

## 2021-08-23 NOTE — Patient Instructions (Signed)
Please complete lab work prior to leaving.   

## 2021-11-15 ENCOUNTER — Ambulatory Visit: Payer: BC Managed Care – PPO | Admitting: Family Medicine

## 2021-11-21 ENCOUNTER — Ambulatory Visit (INDEPENDENT_AMBULATORY_CARE_PROVIDER_SITE_OTHER): Payer: BC Managed Care – PPO | Admitting: Family

## 2021-11-21 VITALS — BP 153/96 | HR 92 | Temp 98.4°F | Resp 16 | Ht 63.5 in | Wt 133.6 lb

## 2021-11-21 DIAGNOSIS — H6123 Impacted cerumen, bilateral: Secondary | ICD-10-CM | POA: Diagnosis not present

## 2021-11-21 DIAGNOSIS — H93A1 Pulsatile tinnitus, right ear: Secondary | ICD-10-CM

## 2021-11-21 DIAGNOSIS — H6120 Impacted cerumen, unspecified ear: Secondary | ICD-10-CM | POA: Insufficient documentation

## 2021-11-21 NOTE — Progress Notes (Signed)
Subjective:     Patient ID: Courtney Keller, female    DOB: 08-22-1956, 66 y.o.   MRN: [id number]  Chief Complaint  Patient presents with   Carotid bruit    Patient reports hearing blood flow a few times a day for about 5 fays last week    HPI Patient reports that she had pulsing in her right ear.  Reports that it occurred 5 days last week. Denies ear pain. 3 days ago it stopped.   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 cannot remember where it was done   MYOMECTOMY      Family History  Problem Relation Age of Onset   Hypertension Mother    Cancer Mother 60       breast cancer   Breast cancer Mother    Hypertension Maternal Grandmother    Colon cancer Neg Hx    Rectal cancer Neg Hx    Stomach cancer Neg Hx     Social History   Socioeconomic History   Marital status: Married    Spouse name: Not on file   Number of children: Not on file   Years of education: Not on file   Highest education level: Not on file  Occupational History   Not on file  Tobacco Use   Smoking status: Never   Smokeless tobacco: Never  Substance and Sexual Activity   Alcohol use: Yes    Alcohol/week: 5.0 standard drinks    Types: 5 Glasses of wine per week    Comment: 5 glasses of wine a week    Drug use: No   Sexual activity: Yes  Other Topics Concern   Not on file  Social History Narrative   1 daughter- Courtney Keller- born 43.Married   Works at [employer] and [employer], runs Armed forces technical officer degree   Enjoys puzzles   One dog   Social Determinants of Systems developer Strain: Not on [employer] Insecurity: Not on file  Transportation Needs: Not on file  Physical Activity: Not on file  Stress: Not on file  Social Connections: Not on file  Intimate Partner Violence: Not on file    Outpatient Medications Prior to Visit  Medication Sig Dispense Refill   benazepril-hydrochlorthiazide (LOTENSIN HCT) 20-25 MG tablet Take 1 tablet by mouth daily. 90 tablet 0   calcium carbonate (OS-CAL) 600 MG TABS Take 600 mg by mouth 2 (two) times daily with a meal.       potassium chloride (KLOR-CON M10) 10 MEQ tablet Take 2 tablets (20 mEq total) by mouth daily. TAKE 2 TABLETS BY MOUTH EVERY DAY 180 tablet 0   No facility-administered medications prior to visit.    No Known Allergies  ROS    See HPI Objective:    Physical Exam Constitutional:      Appearance: Normal appearance.  HENT:     Ears:     Comments: Bilateral cerumen impaction Neck:     Vascular: Normal carotid  pulses. No carotid bruit or JVD.  Pulmonary:     Effort: Pulmonary effort is normal.  Lymphadenopathy:     Cervical: No cervical adenopathy.  Neurological:     Mental Status: She is alert.  Psychiatric:        Mood and Affect: Mood normal.        Behavior: Behavior normal.        Thought Content: Thought content normal.        Judgment: Judgment normal.    BP (!) 153/96 (BP Location: Right Arm, Patient Position: Sitting, Cuff Size: Small)    Pulse 92    Temp 98.4 F (36.9 C) (Oral)    Resp 16    Ht 5' 3.5" (1.613 m)    Wt 133 lb 9.6 oz (60.6 kg)    SpO2 100%    BMI 23.29 kg/m  Wt Readings from Last 3 Encounters:  11/21/21 133 lb 9.6 oz (60.6 kg)  08/23/21 134 lb 12.8 oz (61.1 kg)  08/14/21 137 lb (62.1 kg)       Assessment & Plan:   Problem List Items Addressed This Visit       Unprioritized   Pulsatile tinnitus of right ear - Primary    Resolved, normal carotid pulses, no carotid bruit.  She will let me know if she has recurrent symptoms.       Cerumen  impaction    After verbal consent, pt's ears were irrigated by CMA. Pt tolerated procedure without difficulty. Pt still had some persistent cerumen bilaterally. Gave pt information on wax removal at home with earwax removal kit. Advised to avoid use of Q-Tips.        I am having Courtney Keller maintain her calcium carbonate, benazepril-hydrochlorthiazide, and potassium chloride.  No orders of the defined types were placed in this encounter.

## 2021-11-21 NOTE — Patient Instructions (Signed)
Earwax Buildup, Adult ?The ears produce a substance called earwax that helps keep bacteria out of the ear and protects the skin in the ear canal. Occasionally, earwax can build up in the ear and cause discomfort or hearing loss. ?What are the causes? ?This condition is caused by a buildup of earwax. Ear canals are self-cleaning. Ear wax is made in the outer part of the ear canal and generally falls out in small amounts over time. ?When the self-cleaning mechanism is not working, earwax builds up and can cause decreased hearing and discomfort. Attempting to clean ears with cotton swabs can push the earwax deep into the ear canal and cause decreased hearing and pain. ?What increases the risk? ?This condition is more likely to develop in people who: ?Clean their ears often with cotton swabs. ?Pick at their ears. ?Use earplugs or in-ear headphones often, or wear hearing aids. ?The following factors may also make you more likely to develop this condition: ?Being female. ?Being of older age. ?Naturally producing more earwax. ?Having narrow ear canals. ?Having earwax that is overly thick or sticky. ?Having excess hair in the ear canal. ?Having eczema. ?Being dehydrated. ?What are the signs or symptoms? ?Symptoms of this condition include: ?Reduced or muffled hearing. ?A feeling of fullness in the ear or feeling that the ear is plugged. ?Fluid coming from the ear. ?Ear pain or an itchy ear. ?Ringing in the ear. ?Coughing. ?Balance problems. ?An obvious piece of earwax that can be seen inside the ear canal. ?How is this diagnosed? ?This condition may be diagnosed based on: ?Your symptoms. ?Your medical history. ?An ear exam. During the exam, your health care provider will look into your ear with an instrument called an otoscope. ?You may have tests, including a hearing test. ?How is this treated? ?This condition may be treated by: ?Using ear drops to soften the earwax. ?Having the earwax removed by a health care provider. The  health care provider may: ?Flush the ear with water. ?Use an instrument that has a loop on the end (curette). ?Use a suction device. ?Having surgery to remove the wax buildup. This may be done in severe cases. ?Follow these instructions at home: ? ?Take over-the-counter and prescription medicines only as told by your health care provider. ?Do not put any objects, including cotton swabs, into your ear. You can clean the opening of your ear canal with a washcloth or facial tissue. ?Follow instructions from your health care provider about cleaning your ears. Do not overclean your ears. ?Drink enough fluid to keep your urine pale yellow. This will help to thin the earwax. ?Keep all follow-up visits as told. If earwax builds up in your ears often or if you use hearing aids, consider seeing your health care provider for routine, preventive ear cleanings. Ask your health care provider how often you should schedule your cleanings. ?If you have hearing aids, clean them according to instructions from the manufacturer and your health care provider. ?Contact a health care provider if: ?You have ear pain. ?You develop a fever. ?You have pus or other fluid coming from your ear. ?You have hearing loss. ?You have ringing in your ears that does not go away. ?You feel like the room is spinning (vertigo). ?Your symptoms do not improve with treatment. ?Get help right away if: ?You have bleeding from the affected ear. ?You have severe ear pain. ?Summary ?Earwax can build up in the ear and cause discomfort or hearing loss. ?The most common symptoms of this condition include   reduced or muffled hearing, a feeling of fullness in the ear, or feeling that the ear is plugged. ?This condition may be diagnosed based on your symptoms, your medical history, and an ear exam. ?This condition may be treated by using ear drops to soften the earwax or by having the earwax removed by a health care provider. ?Do not put any objects, including cotton  swabs, into your ear. You can clean the opening of your ear canal with a washcloth or facial tissue. ?This information is not intended to replace advice given to you by your health care provider. Make sure you discuss any questions you have with your health care provider. ?Document Revised: 02/23/2020 Document Reviewed: 02/23/2020 ?Elsevier Patient Education ? 2022 Elsevier Inc. ? ?

## 2021-11-21 NOTE — Assessment & Plan Note (Addendum)
After verbal consent, pt's ears were irrigated by CMA. Pt tolerated procedure without difficulty. Pt still had some persistent cerumen bilaterally. Gave pt information on wax removal at home with earwax removal kit. Advised to avoid use of Q-Tips.

## 2021-11-21 NOTE — Assessment & Plan Note (Signed)
Resolved, normal carotid pulses, no carotid bruit.  She will let me know if she has recurrent symptoms.

## 2022-02-05 ENCOUNTER — Other Ambulatory Visit: Payer: Self-pay | Admitting: Family

## 2022-02-06 ENCOUNTER — Other Ambulatory Visit: Payer: Self-pay | Admitting: Family

## 2022-02-26 ENCOUNTER — Ambulatory Visit: Payer: BC Managed Care – PPO | Admitting: Family

## 2022-02-27 ENCOUNTER — Ambulatory Visit: Payer: BC Managed Care – PPO | Admitting: Family

## 2022-03-02 ENCOUNTER — Ambulatory Visit: Payer: BC Managed Care – PPO | Admitting: Family

## 2022-05-07 ENCOUNTER — Telehealth: Payer: Self-pay

## 2022-05-07 ENCOUNTER — Other Ambulatory Visit: Payer: Self-pay | Admitting: Family

## 2022-05-07 NOTE — Telephone Encounter (Signed)
Done

## 2022-05-09 ENCOUNTER — Ambulatory Visit: Payer: BC Managed Care – PPO | Admitting: Family

## 2022-05-09 VITALS — BP 138/73 | HR 77 | Temp 98.2°F | Resp 16 | Wt 131.0 lb

## 2022-05-09 DIAGNOSIS — I10 Essential (primary) hypertension: Secondary | ICD-10-CM

## 2022-05-09 DIAGNOSIS — Z23 Encounter for immunization: Secondary | ICD-10-CM

## 2022-05-09 LAB — BASIC METABOLIC PANEL
BUN: 14 mg/dL (ref 6–23)
CO2: 32 mEq/L (ref 19–32)
Calcium: 10.2 mg/dL (ref 8.4–10.5)
Chloride: 100 mEq/L (ref 96–112)
Creatinine, Ser: 0.77 mg/dL (ref 0.40–1.20)
GFR: 80.29 mL/min (ref >60.00)
Glucose, Bld: 91 mg/dL (ref 70–99)
Potassium: 3.6 mEq/L (ref 3.5–5.1)
Sodium: 139 mEq/L (ref 135–145)

## 2022-05-09 MED ORDER — POTASSIUM CHLORIDE CRYS ER 10 MEQ PO TBCR: EXTENDED_RELEASE_TABLET | ORAL | 1 refills | Status: DC

## 2022-05-09 MED ORDER — BENAZEPRIL-HYDROCHLOROTHIAZIDE 20-25 MG PO TABS: 1.0000 | ORAL_TABLET | Freq: Every day | ORAL | 1 refills | Status: DC

## 2022-05-09 NOTE — Assessment & Plan Note (Addendum)
BP Readings from Last 3 Encounters:  05/09/22 138/73  11/21/21 (!) 153/96  08/23/21 130/84   bp is at goal. Continue Lotensin HCT.  Obtain follow up bmet.

## 2022-05-09 NOTE — Progress Notes (Addendum)
Subjective:   By signing my name below, I, [name], attest that this documentation has been prepared under the direction and in the presence of Debbrah Alar NP, 05/09/2022     Patient ID: Courtney Keller, female    DOB: 01-Nov-1956, 66 y.o.   MRN: [id number]  Chief Complaint  Patient presents with   Hypertension    Here for follow up    HPI Patient is in today for an office visit.  Refill: She is requesting a refill of 20-25 Mg of Benazepril-Hydrochlorothiazide and 10 MEQ of Klor-Con.   Blood Pressure: As of today's visit, her blood pressure is normal. She is currently taking 20-25 Mg of Benazepril-Hydrochlorothiazide. BP Readings from Last 3 Encounters:  05/09/22 138/73  11/21/21 (!) 153/96  08/23/21 130/84   Pulse Readings from Last 3 Encounters:  05/09/22 77  11/21/21 92  08/23/21 83   Potassium: She is currently taking 10 MEQ of Klor-Con  Lab Results  Component Value Date   K 4.0 08/23/2021   Immunizations: She has had four Covid-19 vaccines. She has not received the Covid-19 Bivalent vaccine. She is UTD on Shingles vaccine. Her tetanus vaccine is valid until 2025. She is overdue for the pneumonia vaccine. She is interested in receiving the vaccine.  Colonoscopy: Last completed on 02/17/2015 Mammogram: Last completed on 05/26/2021 Social: She is still working. She's figuring out what to do for her retirement.   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 cannot remember where it was done   MYOMECTOMY      Family History  Problem Relation Age of Onset   Hypertension Mother     Cancer Mother 63       breast cancer   Breast cancer Mother    Hypertension Maternal Grandmother    Colon cancer Neg Hx    Rectal cancer Neg Hx    Stomach cancer Neg Hx     Social History   Socioeconomic History   Marital status: Married    Spouse name: Not on file   Number of children: Not on file   Years of education: Not on file   Highest education level: Not on file  Occupational History   Not on file  Tobacco Use   Smoking status: Never   Smokeless tobacco: Never  Substance and Sexual Activity   Alcohol use: Yes    Alcohol/week: 5.0 standard drinks of alcohol    Types: 5 Glasses of wine per week    Comment: 5 glasses of wine a week    Drug use: No   Sexual activity: Yes  Other Topics Concern   Not on file  Social History Narrative   1 daughter- [name]- born 8.Married   Works at [employer] and [employer], runs Armed forces technical officer degree   Enjoys puzzles   One dog   Social Determinants of Radio broadcast assistant Strain: Not on [employer] Insecurity: Not on file  Transportation Needs: Not on file  Physical Activity: Not on  file  Stress: Not on file  Social Connections: Not on file  Intimate Partner Violence: Not on file    Outpatient Medications Prior to Visit  Medication Sig Dispense Refill   calcium carbonate (OS-CAL) 600 MG TABS Take 600 mg by mouth 2 (two) times daily with a meal.       benazepril-hydrochlorthiazide (LOTENSIN HCT) 20-25 MG tablet TAKE 1 TABLET BY MOUTH EVERY DAY 90 tablet 0   KLOR-CON M10 10 MEQ tablet TAKE 2 TABLETS (20 MEQ TOTAL) BY MOUTH DAILY. 180 tablet 0   No facility-administered medications prior to visit.    No Known Allergies  ROS See HPI    Objective:    Physical Exam Constitutional:      General: She is not in acute distress.    Appearance: Normal appearance. She is not ill-appearing.  HENT:     Head: Normocephalic and atraumatic.     Right Ear: External ear normal.     Left  Ear: External ear normal.  Eyes:     Extraocular Movements: Extraocular movements intact.     Pupils: Pupils are equal, round, and reactive to light.  Cardiovascular:     Rate and Rhythm: Normal rate and regular rhythm.     Heart sounds: Normal heart sounds. No murmur heard.    No gallop.  Pulmonary:     Effort: Pulmonary effort is normal. No respiratory distress.     Breath sounds: Normal breath sounds. No wheezing or rales.  Skin:    General: Skin is warm and dry.  Neurological:     Mental Status: She is alert and oriented to person, place, and time.  Psychiatric:        Mood and Affect: Mood normal.        Behavior: Behavior normal.        Judgment: Judgment normal.     BP 138/73 (BP Location: Right Arm, Patient Position: Sitting, Cuff Size: Small)   Pulse 77   Temp 98.2 F (36.8 C) (Oral)   Resp 16   Wt 131 lb (59.4 kg)   SpO2 100%   BMI 22.84 kg/m  Wt Readings from Last 3 Encounters:  05/09/22 131 lb (59.4 kg)  11/21/21 133 lb 9.6 oz (60.6 kg)  08/23/21 134 lb 12.8 oz (61.1 kg)       Assessment & Plan:   Problem List Items Addressed This Visit       Unprioritized   HTN (hypertension) - Primary    BP Readings from Last 3 Encounters:  05/09/22 138/73  11/21/21 (!) 153/96  08/23/21 130/84  bp is at goal. Continue Lotensin HCT.  Obtain follow up bmet.       Relevant Medications   benazepril-hydrochlorthiazide (LOTENSIN HCT) 20-25 MG tablet   Other Relevant Orders   Basic metabolic panel   Other Visit Diagnoses     Need for pneumococcal 20-valent conjugate vaccination       Relevant Orders   Pneumococcal conjugate vaccine 20-valent (Prevnar 20) (Completed)      Prevnar 20 today. Recommended that she get her bivalent booster from her pharmacy. Meds ordered this encounter  Medications   benazepril-hydrochlorthiazide (LOTENSIN HCT) 20-25 MG tablet    Sig: Take 1 tablet by mouth daily.    Dispense:  90 tablet    Refill:  1    Order Specific  Question:   Supervising Provider    Answer:   Penni Homans A [[id number]]   potassium chloride (KLOR-CON M10) 10 MEQ tablet  Sig: TAKE 2 TABLETS (20 MEQ TOTAL) BY MOUTH DAILY.    Dispense:  180 tablet    Refill:  1    Order Specific Question:   Supervising Provider    Answer:   Penni Homans A [[id number]]    I, Nance Pear, NP, personally preformed the services described in this documentation.  All medical record entries made by the scribe were at my direction and in my presence.  I have reviewed the chart and discharge instructions (if applicable) and agree that the record reflects my personal performance and is accurate and complete. 05/09/2022   I,Amber Collins,acting as a Education administrator for Nance Pear, NP.,have documented all relevant documentation on the behalf of Nance Pear, NP,as directed by  Nance Pear, NP while in the presence of Nance Pear, NP.   Nance Pear, NP

## 2022-05-14 ENCOUNTER — Telehealth: Payer: Self-pay

## 2022-05-14 ENCOUNTER — Other Ambulatory Visit: Payer: Self-pay

## 2022-05-14 NOTE — Telephone Encounter (Signed)
Yes please. In person is OK too.

## 2022-05-14 NOTE — Telephone Encounter (Signed)
Patient scheduled for tomorrow

## 2022-05-15 ENCOUNTER — Ambulatory Visit (INDEPENDENT_AMBULATORY_CARE_PROVIDER_SITE_OTHER): Payer: BC Managed Care – PPO | Admitting: Family

## 2022-05-15 VITALS — BP 130/83 | HR 94 | Temp 98.8°F | Resp 16

## 2022-05-15 DIAGNOSIS — T50A95A Adverse effect of other bacterial vaccines, initial encounter: Secondary | ICD-10-CM | POA: Diagnosis not present

## 2022-05-15 DIAGNOSIS — R2231 Localized swelling, mass and lump, right upper limb: Secondary | ICD-10-CM

## 2022-05-15 NOTE — Assessment & Plan Note (Signed)
New. Overall improving. Does not appear infected at this time.  Recommended that pt continue to monitor and let us know if increased pain/swelling/redness or if it does not continue to improve.

## 2022-08-01 ENCOUNTER — Telehealth: Payer: Self-pay | Admitting: *Deleted

## 2022-08-01 ENCOUNTER — Encounter: Payer: Self-pay | Admitting: Family

## 2022-08-01 ENCOUNTER — Ambulatory Visit: Payer: BC Managed Care – PPO | Admitting: Family

## 2022-08-01 VITALS — BP 130/88 | HR 80 | Temp 97.8°F | Resp 18 | Ht 63.5 in | Wt 126.0 lb

## 2022-08-01 DIAGNOSIS — R634 Abnormal weight loss: Secondary | ICD-10-CM

## 2022-08-01 DIAGNOSIS — I10 Essential (primary) hypertension: Secondary | ICD-10-CM | POA: Diagnosis not present

## 2022-08-01 DIAGNOSIS — Z1231 Encounter for screening mammogram for malignant neoplasm of breast: Secondary | ICD-10-CM

## 2022-08-01 LAB — TSH: TSH: 1 u[IU]/mL (ref 0.35–5.50)

## 2022-08-01 NOTE — Progress Notes (Signed)
Subjective:   By signing my name below, I, Shehryar Baig, attest that this documentation has been prepared under the direction and in the presence of Debbrah Alar, NP 08/01/2022    Patient ID: Courtney Keller, female    DOB: 17-Dec-1955, 67 y.o.   MRN: [id number]  Chief Complaint  Patient presents with   Weight Loss    Pt states she has been intentionally losing weight but her family is concerned that it may be something else health related    HPI Patient is in today for a office visit.   Weight- She has lost 7 lb's since January 2023. She is watching what she eats carefully. She reports prior to contracting Covid-19 she was wearing size 14 lb' and is now wearing size 10's. She reports losing weight since she first contracted Covid-19. She weighed around 144 lb's in 2021. She notes her family is mainly worried for her weight.  Wt Readings from Last 3 Encounters:  08/01/22 126 lb (57.2 kg)  05/09/22 131 lb (59.4 kg)  11/21/21 133 lb 9.6 oz (60.6 kg)   Blood pressure- Her blood pressure is doing well during this visit. She continues taking 20-25 mg Lotensin HCT.  BP Readings from Last 3 Encounters:  08/01/22 130/88  05/15/22 130/83  05/09/22 138/73   Pulse Readings from Last 3 Encounters:  08/01/22 80  05/15/22 94  05/09/22 77   Mammogram- She is due for mammogram and is interested in setting up an appointment.   Immunizations- She is interested in receiving the flu vaccine during this visit. She was informed of the upcomming Covid-19 booster vaccine.    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 cannot remember where it was done   MYOMECTOMY      Family History  Problem Relation Age of Onset   Hypertension Mother    Cancer Mother 42       breast cancer   Breast cancer Mother    Hypertension Maternal Grandmother    Colon cancer Neg Hx    Rectal cancer Neg Hx    Stomach cancer Neg Hx     Social History   Socioeconomic History   Marital status: Married    Spouse name: Not on file   Number of children: Not on file   Years of education: Not on file   Highest education level: Not on file  Occupational History   Not on file  Tobacco Use   Smoking status: Never   Smokeless tobacco: Never  Substance and Sexual Activity   Alcohol use: Yes    Alcohol/week: 5.0 standard drinks of alcohol    Types: 5 Glasses of wine per week    Comment: 5 glasses of wine a week    Drug use: No   Sexual activity: Yes  Other Topics Concern   Not on file  Social History Narrative   1 daughter- [name]- born 30.Married   Works at [employer] and [employer], runs [employer]  Completed masters degree   Enjoys puzzles   One dog   Social Determinants of Radio broadcast assistant Strain: Not on file  Food Insecurity: Not on file  Transportation Needs: Not on file  Physical Activity: Not on file  Stress: Not on file  Social Connections: Not on file  Intimate Partner Violence: Not on file    Outpatient Medications Prior to Visit  Medication Sig Dispense Refill   benazepril-hydrochlorthiazide (LOTENSIN HCT) 20-25 MG tablet Take 1 tablet by mouth daily. 90 tablet 1   calcium carbonate (OS-CAL) 600 MG TABS Take 600 mg by mouth 2 (two) times daily with a meal.       potassium chloride (KLOR-CON M10) 10 MEQ tablet TAKE 2 TABLETS (20 MEQ TOTAL) BY MOUTH DAILY. 180 tablet 1   No facility-administered medications prior to visit.    No Known Allergies  ROS     Objective:    Physical Exam Constitutional:      General: She is not  in acute distress.    Appearance: Normal appearance. She is not ill-appearing.  HENT:     Head: Normocephalic and atraumatic.     Right Ear: External ear normal.     Left Ear: External ear normal.  Eyes:     Extraocular Movements: Extraocular movements intact.     Pupils: Pupils are equal, round, and reactive to light.  Cardiovascular:     Rate and Rhythm: Normal rate and regular rhythm.     Heart sounds: Normal heart sounds. No murmur heard.    No gallop.  Pulmonary:     Effort: Pulmonary effort is normal. No respiratory distress.     Breath sounds: Normal breath sounds. No wheezing or rales.  Skin:    General: Skin is warm and dry.  Neurological:     Mental Status: She is alert and oriented to person, place, and time.  Psychiatric:        Judgment: Judgment normal.     BP 130/88 (BP Location: Right Arm, Patient Position: Sitting, Cuff Size: Normal)   Pulse 80   Temp 97.8 F (36.6 C) (Oral)   Resp 18   Ht 5' 3.5" (1.613 m)   Wt 126 lb (57.2 kg)   SpO2 100%   BMI 21.97 kg/m  Wt Readings from Last 3 Encounters:  08/01/22 126 lb (57.2 kg)  05/09/22 131 lb (59.4 kg)  11/21/21 133 lb 9.6 oz (60.6 kg)       Assessment & Plan:   Problem List Items Addressed This Visit       Unprioritized   Weight loss - Primary    She is exercising more and has improved her diet significantly.  Her family is concerned about her weight loss. She is still WNL weight and her weight loss has been intentional. Will check TSH.  I advised pt to try to maintain current weight. Let us know if she has any unintentional weight loss. Follow up in 2 months.       Relevant Orders   TSH   HTN (hypertension)    BP at goal on lotensin hct. BMET up to date. Continue same.       Other Visit Diagnoses     Encounter for screening mammogram for malignant neoplasm of breast       Relevant Orders   MM 3D SCREEN BREAST BILATERAL        No orders of the defined types were placed in this  encounter.   I,  Nance Pear, NP, personally preformed the services described in this documentation.  All medical record entries made by the scribe were at my direction and in my presence.  I have reviewed the chart and discharge instructions (if applicable) and agree that the record reflects my personal performance and is accurate and complete. 08/01/2022   I,Shehryar Baig,acting as a Education administrator for Nance Pear, NP.,have documented all relevant documentation on the behalf of Nance Pear, NP,as directed by  Nance Pear, NP while in the presence of Nance Pear, NP.   Nance Pear, NP

## 2022-08-01 NOTE — Assessment & Plan Note (Signed)
She is exercising more and has improved her diet significantly.  Her family is concerned about her weight loss. She is still WNL weight and her weight loss has been intentional. Will check TSH.  I advised pt to try to maintain current weight. Let us know if she has any unintentional weight loss. Follow up in 2 months.

## 2022-08-01 NOTE — Telephone Encounter (Signed)
Pt seen  by PCP this morning but left before completing labs.  Left detailed message that she could walk in anytime today from now until 4pm. If she is unable to come back today she will need to call to schedule a lab appointment for another day.

## 2022-08-01 NOTE — Progress Notes (Signed)
Subjective:     Patient ID: Courtney Keller, female    DOB: 1956-06-06, 66 y.o.   MRN: [id number]  Chief Complaint  Patient presents with   Weight Loss    Pt states she has been intentionally losing weight but her family is concerned that it may be something else health related    HPI Patient is in today for follow up.  Wt Readings from Last 3 Encounters:  08/01/22 126 lb (57.2 kg)  05/09/22 131 lb (59.4 kg)  11/21/21 133 lb 9.6 oz (60.6 kg)   HTN- Maintained on lotensin HCT.   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)    Cancer ([place])    Hypertension     Past Surgical History:  Procedure Laterality Date   ABDOMINAL HYSTERECTOMY  2006   BREAST LUMPECTOMY Left 2006   COLONOSCOPY     colon 10 + years ago normal per pt. cannot remember where it was done   MYOMECTOMY      Family History  Problem Relation Age of Onset   Hypertension Mother    Cancer Mother 60       breast cancer   Breast cancer Mother    Hypertension Maternal Grandmother    Colon cancer Neg Hx    Rectal cancer Neg Hx    Stomach cancer Neg Hx     Social History   Socioeconomic History   Marital status: Married    Spouse name: Not on file   Number of children: Not on file   Years of education: Not on file   Highest education level: Not on file  Occupational History   Not on file  Tobacco Use   Smoking status: Never   Smokeless tobacco: Never  Substance and Sexual Activity   Alcohol use: Yes    Alcohol/week: 5.0 standard drinks of alcohol    Types: 5 Glasses of wine per week    Comment: 5 glasses of wine a week    Drug use: No   Sexual activity: Yes  Other Topics Concern   Not on  file  Social History Narrative   1 daughter- Courtney Keller- born 18.Married   Works at [employer] and [employer], runs Armed forces technical officer degree   Enjoys puzzles   One dog   Social Determinants of Radio broadcast assistant Strain: Not on [employer] Insecurity: Not on file  Transportation Needs: Not on file  Physical Activity: Not on file  Stress: Not on file  Social Connections: Not on file  Intimate Partner Violence: Not on file    Outpatient Medications Prior to Visit  Medication Sig Dispense Refill   benazepril-hydrochlorthiazide (LOTENSIN HCT) 20-25 MG tablet Take 1 tablet by mouth daily. 90 tablet 1   calcium carbonate (OS-CAL) 600 MG TABS Take 600 mg by mouth 2 (two) times daily with a meal.       potassium chloride (KLOR-CON M10) 10 MEQ tablet TAKE 2 TABLETS (20 MEQ TOTAL) BY MOUTH DAILY. 180 tablet 1   No facility-administered medications prior to visit.    No Known Allergies  ROS See HPI    Objective:  Physical Exam Constitutional:      General: She is not in acute distress.    Appearance: Normal appearance. She is well-developed.  HENT:     Head: Normocephalic and atraumatic.     Right Ear: External ear normal.     Left Ear: External ear normal.  Eyes:     General: No scleral icterus. Neck:     Thyroid: No thyromegaly.  Cardiovascular:     Rate and Rhythm: Normal rate and regular rhythm.     Heart sounds: Normal heart sounds. No murmur heard. Pulmonary:     Effort: Pulmonary effort is normal. No respiratory distress.     Breath sounds: Normal breath sounds. No wheezing.  Musculoskeletal:     Cervical back: Neck supple.  Skin:    General: Skin is warm and dry.  Neurological:     Mental Status: She is alert and oriented to person, place, and time.  Psychiatric:        Mood and Affect: Mood normal.        Behavior: Behavior normal.        Thought Content: Thought content normal.        Judgment: Judgment normal.     BP  130/88 (BP Location: Right Arm, Patient Position: Sitting, Cuff Size: Normal)   Pulse 80   Temp 97.8 F (36.6 C) (Oral)   Resp 18   Ht 5' 3.5" (1.613 m)   Wt 126 lb (57.2 kg)   SpO2 100%   BMI 21.97 kg/m  Wt Readings from Last 3 Encounters:  08/01/22 126 lb (57.2 kg)  05/09/22 131 lb (59.4 kg)  11/21/21 133 lb 9.6 oz (60.6 kg)       Assessment & Plan:   Problem List Items Addressed This Visit       Unprioritized   Weight loss - Primary    She is exercising more and has improved her diet significantly.  Her family is concerned about her weight loss. She is still WNL weight and her weight loss has been intentional. Will check TSH.  I advised pt to try to maintain current weight. Let us know if she has any unintentional weight loss. Follow up in 2 months.       Relevant Orders   TSH   HTN (hypertension)    BP at goal on lotensin hct. BMET up to date. Continue same.       Other Visit Diagnoses     Encounter for screening mammogram for malignant neoplasm of breast       Relevant Orders   MM 3D SCREEN BREAST BILATERAL       I am having Toba M. Johnson-Taylor maintain her calcium carbonate, benazepril-hydrochlorthiazide, and potassium chloride.  No orders of the defined types were placed in this encounter.

## 2022-08-01 NOTE — Assessment & Plan Note (Signed)
BP at goal on lotensin hct. BMET up to date. Continue same.

## 2022-09-27 ENCOUNTER — Ambulatory Visit
Admission: RE | Admit: 2022-09-27 | Discharge: 2022-09-27 | Disposition: A | Discharge status: Home / Self Care | Payer: BC Managed Care – PPO | Source: Ambulatory Visit | Attending: Family | Admitting: Family

## 2022-09-27 DIAGNOSIS — Z1231 Encounter for screening mammogram for malignant neoplasm of breast: Secondary | ICD-10-CM

## 2022-10-01 ENCOUNTER — Ambulatory Visit: Payer: BC Managed Care – PPO | Admitting: Family

## 2022-10-01 VITALS — BP 133/82 | HR 80 | Temp 97.9°F | Resp 16 | Wt 128.0 lb

## 2022-10-01 DIAGNOSIS — R634 Abnormal weight loss: Secondary | ICD-10-CM

## 2022-10-01 DIAGNOSIS — I10 Essential (primary) hypertension: Secondary | ICD-10-CM

## 2022-10-01 DIAGNOSIS — Z23 Encounter for immunization: Secondary | ICD-10-CM

## 2022-10-01 MED ORDER — POTASSIUM CHLORIDE CRYS ER 10 MEQ PO TBCR: EXTENDED_RELEASE_TABLET | ORAL | 1 refills | Status: AC

## 2022-10-01 MED ORDER — BENAZEPRIL-HYDROCHLOROTHIAZIDE 20-25 MG PO TABS: 1.0000 | ORAL_TABLET | Freq: Every day | ORAL | 1 refills | Status: DC

## 2022-10-01 NOTE — Assessment & Plan Note (Signed)
Weight has stabilized. She is eating a healthy diet.  Wt Readings from Last 3 Encounters:  10/01/22 128 lb (58.1 kg)  08/01/22 126 lb (57.2 kg)  05/09/22 131 lb (59.4 kg)

## 2022-10-01 NOTE — Assessment & Plan Note (Signed)
BP looks good on current regimen. Continue lotensin HCT. Check follow up bmet to assess K+ on Kdur.

## 2022-10-01 NOTE — Progress Notes (Signed)
Subjective:   By signing my name below, I, Courtney Keller, attest that this documentation has been prepared under the direction and in the presence of Debbrah Alar, 10/01/2022.   Patient ID: Courtney Keller, female    DOB: [birth year], 66 y.o.   MRN: [id number]  Chief Complaint  Patient presents with   Weight Loss    Here for follow up    HPI Patient is in today for an office visit.  Weight loss Patient reports that she lost two pounds since her last visit and she is intentional about eating healthy choices.  Hypertension Patients blood pressure is normal this visit.She is complaint with her 20-25 mg Lotensin. BP Readings from Last 3 Encounters:  10/01/22 133/82  08/01/22 130/88  05/15/22 130/83   Pulse Readings from Last 3 Encounters:  10/01/22 80  08/01/22 80  05/15/22 94    Immunizations She is receiving an influenza vaccine this visit.   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 cannot remember where it was done   MYOMECTOMY      Family History  Problem Relation Age of Onset   Hypertension Mother    Cancer Mother 78       breast cancer   Breast cancer Mother    Hypertension Maternal Grandmother    Colon cancer Neg Hx    Rectal cancer Neg Hx    Stomach cancer Neg Hx     Social History   Socioeconomic History   Marital status: Married    Spouse name: Not on file   Number of children: Not on file   Years of education: Not on file   Highest education level: Not on file  Occupational History   Not on file  Tobacco Use   Smoking  status: Never   Smokeless tobacco: Never  Substance and Sexual Activity   Alcohol use: Yes    Alcohol/week: 5.0 standard drinks of alcohol    Types: 5 Glasses of wine per week    Comment: 5 glasses of wine a week    Drug use: No   Sexual activity: Yes  Other Topics Concern   Not on file  Social History Narrative   1 daughter- [name]- born 38.Married   Works at [employer] and [employer], runs Armed forces technical officer degree   Enjoys puzzles   One dog   Social Determinants of Radio broadcast assistant Strain: Not on [employer] Insecurity: Not on file  Transportation Needs: Not on file  Physical Activity: Not on file  Stress: Not on file  Social Connections: Not on file  Intimate Partner Violence: Not on file    Outpatient Medications Prior to Visit  Medication Sig Dispense Refill   calcium carbonate (OS-CAL) 600 MG TABS Take 600 mg by mouth 2 (two) times daily with a meal.       benazepril-hydrochlorthiazide (LOTENSIN HCT) 20-25 MG tablet Take 1 tablet by mouth daily. 90 tablet 1   potassium chloride (  KLOR-CON M10) 10 MEQ tablet TAKE 2 TABLETS (20 MEQ TOTAL) BY MOUTH DAILY. 180 tablet 1   No facility-administered medications prior to visit.    No Known Allergies  ROS    See HPI Objective:    Physical Exam Constitutional:      General: She is not in acute distress.    Appearance: Normal appearance. She is not ill-appearing.  HENT:     Head: Normocephalic and atraumatic.     Right Ear: External ear normal.     Left Ear: External ear normal.  Eyes:     Extraocular Movements: Extraocular movements intact.     Pupils: Pupils are equal, round, and reactive to light.  Cardiovascular:     Rate and Rhythm: Normal rate and regular rhythm.     Heart sounds: Normal heart sounds. No murmur heard.    No gallop.  Pulmonary:     Effort: Pulmonary effort is normal. No respiratory distress.     Breath sounds: Normal breath sounds. No wheezing or rales.   Skin:    General: Skin is warm and dry.  Neurological:     Mental Status: She is alert and oriented to person, place, and time.  Psychiatric:        Mood and Affect: Mood normal.        Behavior: Behavior normal.        Judgment: Judgment normal.     BP 133/82 (BP Location: Right Arm, Patient Position: Sitting, Cuff Size: Small)   Pulse 80   Temp 97.9 F (36.6 C) (Oral)   Resp 16   Wt 128 lb (58.1 kg)   SpO2 100%   BMI 22.32 kg/m  Wt Readings from Last 3 Encounters:  10/01/22 128 lb (58.1 kg)  08/01/22 126 lb (57.2 kg)  05/09/22 131 lb (59.4 kg)       Assessment & Plan:   Problem List Items Addressed This Visit       Unprioritized   Weight loss    Weight has stabilized. She is eating a healthy diet.  Wt Readings from Last 3 Encounters:  10/01/22 128 lb (58.1 kg)  08/01/22 126 lb (57.2 kg)  05/09/22 131 lb (59.4 kg)        HTN (hypertension) - Primary    BP looks good on current regimen. Continue lotensin HCT. Check follow up bmet to assess K+ on Kdur.       Relevant Medications   benazepril-hydrochlorthiazide (LOTENSIN HCT) 20-25 MG tablet   Other Relevant Orders   Basic metabolic panel   Meds ordered this encounter  Medications   potassium chloride (KLOR-CON M10) 10 MEQ tablet    Sig: TAKE 2 TABLETS (20 MEQ TOTAL) BY MOUTH DAILY.    Dispense:  180 tablet    Refill:  1    Order Specific Question:   Supervising Provider    Answer:   Penni Homans A [[id number]]   benazepril-hydrochlorthiazide (LOTENSIN HCT) 20-25 MG tablet    Sig: Take 1 tablet by mouth daily.    Dispense:  90 tablet    Refill:  1    Order Specific Question:   Supervising Provider    Answer:   Pat Patrick, personally preformed the services described in this documentation.  All medical record entries made by the scribe were at my direction and in my presence.  I have reviewed the chart and discharge instructions (if applicable) and agree that the record  reflects  my personal performance and is accurate and complete. 10/01/2022.   I,Verona Buck,acting as a Education administrator for [employer], NP.,have documented all relevant documentation on the behalf of Nance Pear, NP,as directed by  Nance Pear, NP while in the presence of Nance Pear, NP.    Nance Pear, NP

## 2022-10-08 ENCOUNTER — Encounter: Payer: Self-pay | Admitting: Family

## 2022-11-09 ENCOUNTER — Ambulatory Visit: Payer: BC Managed Care – PPO | Admitting: Family

## 2022-11-09 ENCOUNTER — Encounter: Payer: Self-pay | Admitting: Family

## 2022-11-09 VITALS — BP 126/88 | HR 87 | Temp 97.5°F | Resp 16 | Wt 127.0 lb

## 2022-11-09 DIAGNOSIS — Z780 Asymptomatic menopausal state: Secondary | ICD-10-CM

## 2022-11-09 DIAGNOSIS — I10 Essential (primary) hypertension: Secondary | ICD-10-CM

## 2022-11-09 DIAGNOSIS — E785 Hyperlipidemia, unspecified: Secondary | ICD-10-CM

## 2022-11-09 DIAGNOSIS — Z0000 Encounter for general adult medical examination without abnormal findings: Secondary | ICD-10-CM

## 2022-11-09 LAB — COMPREHENSIVE METABOLIC PANEL
ALT: 11 U/L (ref 0–35)
AST: 24 U/L (ref 0–37)
Albumin: 4.5 g/dL (ref 3.5–5.2)
Alkaline Phosphatase: 52 U/L (ref 39–117)
BUN: 16 mg/dL (ref 6–23)
CO2: 35 mEq/L — ABNORMAL HIGH (ref 19–32)
Calcium: 9.9 mg/dL (ref 8.4–10.5)
Chloride: 99 mEq/L (ref 96–112)
Creatinine, Ser: 0.78 mg/dL (ref 0.40–1.20)
GFR: 78.78 mL/min (ref >60.00)
Glucose, Bld: 89 mg/dL (ref 70–99)
Potassium: 3.6 mEq/L (ref 3.5–5.1)
Sodium: 142 mEq/L (ref 135–145)
Total Bilirubin: 1 mg/dL (ref 0.2–1.2)
Total Protein: 7.4 g/dL (ref 6.0–8.3)

## 2022-11-09 LAB — LIPID PANEL
Cholesterol: 234 mg/dL — ABNORMAL HIGH (ref 0–200)
HDL: 77 mg/dL (ref >39.00)
LDL Cholesterol: 143 mg/dL — ABNORMAL HIGH (ref 0–99)
NonHDL: 157.38
Total CHOL/HDL Ratio: 3
Triglycerides: 70 mg/dL (ref 0.0–149.0)
VLDL: 14 mg/dL (ref 0.0–40.0)

## 2022-11-09 NOTE — Progress Notes (Signed)
Subjective:   By signing my name below, I, Courtney Keller, attest that this documentation has been prepared under the direction and in the presence of [place], NP 11/09/2022     Patient ID: Courtney Keller, female    DOB: [birth year], 66 y.o.   MRN: [id number]  Chief Complaint  Patient presents with   Annual Exam    HPI Patient is in today for a comprehensive physical exam  Blood Pressure: Her blood pressure as of today's visit, is normal. She is currently taking 20-25 mg of Lotensin HCT. BP Readings from Last 3 Encounters:  11/09/22 126/88  10/01/22 133/82  08/01/22 130/88   She denies having any fever, hearing or vision symptoms, new muscle pain, joint pain , new moles, rashes, congestion, sinus pain, sore throat, palpations, cough, SOB ,wheezing,n/v/d constipation, blood in stool, dysuria, frequency, hematuria, depression, anxiety, headaches at this time  Social history: She reports no recent surgeries. She denies of any changes to her family medical history.  Colonoscopy: Last completed on 02/17/2015 Dexa: Last completed on 08/20/2018  Mammogram: Last completed on 09/27/2022 Immunizations: She is UTD on the influenza, Shingles and Pneumonia vaccine.  Diet: She is currently maintaining a healthy diet.  Exercise: She is not exercising regularly. She regularly exercises during the summer.  Dental: She is UTD on dental exams. Vision: She is UTD on vision exams.   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 cannot remember where it was done   MYOMECTOMY      Family History  Problem  Relation Age of Onset   Hypertension Mother    Cancer Mother 25       breast cancer   Breast cancer Mother    Hypertension Maternal Grandmother    Colon cancer Neg Hx    Rectal cancer Neg Hx    Stomach cancer Neg Hx     Social History   Socioeconomic History   Marital status: Married    Spouse name: Not on file   Number of children: Not on file   Years of education: Not on file   Highest education level: Not on file  Occupational History   Not on file  Tobacco Use   Smoking status: Never   Smokeless tobacco: Never  Substance and Sexual Activity   Alcohol use: Yes    Alcohol/week: 5.0 standard drinks of alcohol    Types: 5 Glasses of wine per week    Comment: 5 glasses of wine a week    Drug use: No   Sexual activity: Yes  Other Topics Concern   Not on file  Social History Narrative   1 daughter- [name]- born 71.Married   Works at [employer] and [employer], runs Armed forces technical officer degree   Enjoys puzzles   One dog   Social Determinants of Radio broadcast assistant Strain: Not on [employer] Insecurity: Not on file  Transportation Needs: Not on file  Physical Activity: Not on file  Stress: Not on file  Social Connections: Not on file  Intimate Partner Violence: Not on file    Outpatient Medications Prior to Visit  Medication Sig Dispense Refill   benazepril-hydrochlorthiazide (LOTENSIN HCT) 20-25 MG tablet Take 1 tablet by mouth daily. 90 tablet 1   calcium carbonate (OS-CAL) 600 MG TABS Take 600 mg by mouth 2 (two) times daily with a meal.       potassium chloride (KLOR-CON M10) 10 MEQ tablet TAKE 2 TABLETS (20 MEQ TOTAL) BY MOUTH DAILY. 180 tablet 1   No facility-administered medications prior to visit.    No Known Allergies  Review of Systems  Constitutional:  Negative for fever.  HENT:  Negative for congestion, sinus pain and sore throat.   Respiratory:  Negative for cough, shortness of breath and wheezing.    Cardiovascular:  Negative for palpitations.  Gastrointestinal:  Negative for blood in stool, constipation, diarrhea, nausea and vomiting.  Genitourinary:  Negative for dysuria, frequency and hematuria.  Musculoskeletal:  Negative for joint pain and myalgias.  Skin:  Negative for rash.       (-) New Moles  Neurological:  Negative for headaches.  Psychiatric/Behavioral:  Negative for depression. The patient is not nervous/anxious.        Objective:    Physical Exam Constitutional:      General: She is not in acute distress.    Appearance: Normal appearance. She is not ill-appearing.  HENT:     Head: Normocephalic and atraumatic.     Right Ear: Tympanic membrane, ear canal and external ear normal.     Left Ear: Tympanic membrane, ear canal and external ear normal. There is impacted cerumen.     Mouth/Throat:     Pharynx: No oropharyngeal exudate.  Eyes:     Extraocular Movements: Extraocular movements intact.     Pupils: Pupils are equal, round, and reactive to light.  Cardiovascular:     Rate and Rhythm: Normal rate and regular rhythm.     Heart sounds: Normal heart sounds. No murmur heard.    No gallop.  Pulmonary:     Effort: Pulmonary effort is normal. No respiratory distress.     Breath sounds: Normal breath sounds. No wheezing or rales.  Abdominal:     General: Bowel sounds are normal. There is no distension.     Palpations: Abdomen is soft.     Tenderness: There is no abdominal tenderness. There is no guarding.  Musculoskeletal:     Comments: 5/5 strength in both upper and lower extremities    Skin:    General: Skin is warm and dry.  Neurological:     Mental Status: She is alert and oriented to person, place, and time.     Deep Tendon Reflexes:     Reflex Scores:      Patellar reflexes are 2+ on the right side and 2+ on the left side. Psychiatric:        Mood and Affect: Mood normal.        Behavior: Behavior normal.        Judgment: Judgment normal.     BP  126/88 (BP Location: Right Arm, Patient Position: Sitting, Cuff Size: Small)   Pulse 87   Temp (!) 97.5 F (36.4 C) (Oral)   Resp 16   Wt 127 lb (57.6 kg)   SpO2 100%   BMI 22.14 kg/m  Wt Readings from Last 3 Encounters:  11/09/22 127 lb (57.6 kg)  10/01/22 128 lb (58.1 kg)  08/01/22 126 lb (57.2  kg)       Assessment & Plan:   Problem List Items Addressed This Visit       Unprioritized   Preventative health care    Continue healthy diet, exercise and weight loss. Obtain labs as ordered. Refer for dexa.       HTN (hypertension) - Primary    BP Readings from Last 3 Encounters:  11/09/22 126/88  10/01/22 133/82  08/01/22 130/88  Stable on lotensin HCT.  Continue same.       Other Visit Diagnoses     Postmenopausal estrogen deficiency       Relevant Orders   DG Bone Density   Hyperlipidemia, unspecified hyperlipidemia type       Relevant Orders   Comp Met (CMET)   Lipid panel      No orders of the defined types were placed in this encounter.   I, Nance Pear, NP, personally preformed the services described in this documentation.  All medical record entries made by the scribe were at my direction and in my presence.  I have reviewed the chart and discharge instructions (if applicable) and agree that the record reflects my personal performance and is accurate and complete. 11/09/2022   I,Amber Collins,acting as a scribe for Nance Pear, NP.,have documented all relevant documentation on the behalf of Nance Pear, NP,as directed by  Nance Pear, NP while in the presence of Nance Pear, NP.    Nance Pear, NP

## 2022-11-09 NOTE — Assessment & Plan Note (Signed)
Continue healthy diet, exercise and weight loss. Obtain labs as ordered. Refer for dexa.

## 2022-11-09 NOTE — Assessment & Plan Note (Signed)
BP Readings from Last 3 Encounters:  11/09/22 126/88  10/01/22 133/82  08/01/22 130/88   Stable on lotensin HCT.  Continue same.

## 2022-12-14 ENCOUNTER — Telehealth (HOSPITAL_BASED_OUTPATIENT_CLINIC_OR_DEPARTMENT_OTHER): Payer: Self-pay

## 2022-12-20 ENCOUNTER — Encounter: Payer: Self-pay | Admitting: Family

## 2022-12-20 ENCOUNTER — Ambulatory Visit (HOSPITAL_BASED_OUTPATIENT_CLINIC_OR_DEPARTMENT_OTHER)
Admission: RE | Admit: 2022-12-20 | Discharge: 2022-12-20 | Disposition: A | Discharge status: Home / Self Care | Payer: BC Managed Care – PPO | Source: Ambulatory Visit | Attending: Family | Admitting: Family

## 2022-12-20 DIAGNOSIS — Z780 Asymptomatic menopausal state: Secondary | ICD-10-CM | POA: Insufficient documentation

## 2023-03-04 ENCOUNTER — Encounter: Payer: Self-pay | Admitting: *Deleted

## 2023-03-20 ENCOUNTER — Other Ambulatory Visit: Payer: Self-pay

## 2023-03-20 MED ORDER — BENAZEPRIL-HYDROCHLOROTHIAZIDE 20-25 MG PO TABS: 1.0000 | ORAL_TABLET | Freq: Every day | ORAL | 0 refills | Status: AC

## 2023-08-26 ENCOUNTER — Ambulatory Visit: Payer: BC Managed Care – PPO | Admitting: Family

## 2023-08-27 ENCOUNTER — Ambulatory Visit: Payer: BC Managed Care – PPO | Admitting: Family

## 2024-05-06 ENCOUNTER — Ambulatory Visit: Payer: Self-pay | Admitting: Family

## 2024-05-15 ENCOUNTER — Ambulatory Visit: Payer: Self-pay | Admitting: Family
